# Patient Record
Sex: Female | Born: 2000 | Race: White | Hispanic: No | Marital: Single | Smoking: Never smoker
Health system: Southern US, Community
[De-identification: ages and names within clinical notes are randomized; demographics above are authoritative.]

## PROBLEM LIST (undated history)

## (undated) DIAGNOSIS — O139 Gestational [pregnancy-induced] hypertension without significant proteinuria, unspecified trimester: Secondary | ICD-10-CM

## (undated) DIAGNOSIS — R519 Headache, unspecified: Secondary | ICD-10-CM

## (undated) DIAGNOSIS — F419 Anxiety disorder, unspecified: Secondary | ICD-10-CM

## (undated) DIAGNOSIS — B999 Unspecified infectious disease: Secondary | ICD-10-CM

## (undated) DIAGNOSIS — F32A Depression, unspecified: Secondary | ICD-10-CM

## (undated) DIAGNOSIS — F329 Major depressive disorder, single episode, unspecified: Secondary | ICD-10-CM

## (undated) HISTORY — DX: Gestational (pregnancy-induced) hypertension without significant proteinuria, unspecified trimester: O13.9

---
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
## 2018-01-18 ENCOUNTER — Ambulatory Visit (INDEPENDENT_AMBULATORY_CARE_PROVIDER_SITE_OTHER): Payer: Medicaid Other | Admitting: Advanced Practice Midwife

## 2018-01-18 VITALS — BP 123/76 | HR 72 | Wt 121.8 lb

## 2018-01-18 DIAGNOSIS — Z3042 Encounter for surveillance of injectable contraceptive: Secondary | ICD-10-CM

## 2018-01-18 DIAGNOSIS — Z3202 Encounter for pregnancy test, result negative: Secondary | ICD-10-CM | POA: Diagnosis not present

## 2018-01-18 DIAGNOSIS — Z30011 Encounter for initial prescription of contraceptive pills: Secondary | ICD-10-CM | POA: Insufficient documentation

## 2018-01-18 LAB — POCT URINE PREGNANCY: Preg Test, Ur: NEGATIVE

## 2018-01-18 MED ORDER — NORGESTIMATE-ETH ESTRADIOL 0.25-35 MG-MCG PO TABS: 1.0000 | ORAL_TABLET | Freq: Every day | ORAL | 11 refills | Status: DC

## 2018-01-18 NOTE — Progress Notes (Signed)
    GYNECOLOGY CONTRACEPTION ENCOUNTER NOTE  Subjective:   Stephanie Velazquez is a 17 y.o. G0P0000 female here for a routine annual gynecologic exam.  Current complaints: patient is dissatisfied with intermittent spotting she experiences with Depo Provera.   Denies heavy vaginal bleeding, discharge, pelvic pain, problems with intercourse or other gynecologic concerns. Patient states she recently broke up with her boyfriend and has not had intercourse in the past two weeks.   Gynecologic History Patient's last menstrual period was 01/18/2018. Contraception: Depo-Provera injections Pap and Mammogram n/a age 17  Obstetric History OB History  Gravida Para Term Preterm AB Living  0 0 0 0 0 0  SAB TAB Ectopic Multiple Live Births  0 0 0 0 0    No past medical history on file.  Past Surgical History:  Procedure Laterality Date  . WISDOM TOOTH EXTRACTION  04/02/2016    Current Outpatient Medications on File Prior to Visit  Medication Sig Dispense Refill  . medroxyPROGESTERone Acetate 150 MG/ML SUSY INJECT 1 ML (150 MG TOTAL) INTO THE MUSCLE EVERY 3 (THREE) MONTHS. 1 Syringe 3   Current Facility-Administered Medications on File Prior to Visit  Medication Dose Route Frequency Provider Last Rate Last Dose  . medroxyPROGESTERone (DEPO-PROVERA) injection 150 mg  150 mg Intramuscular Q90 days Anyanwu, Ugonna A, MD   150 mg at 10/25/17 1326    No Known Allergies  Social History:  reports that she has never smoked. She has never used smokeless tobacco. She reports that she does not drink alcohol or use drugs.  No family history on file.  The following portions of the patient's history were reviewed and updated as appropriate: allergies, current medications, past family history, past medical history, past social history, past surgical history and problem list.  Review of Systems Pertinent items noted in HPI and remainder of comprehensive ROS otherwise negative.   Objective:  BP 123/76    Pulse 72   Wt 121 lb 12.8 oz (55.2 kg)   LMP 01/18/2018   BMI 18.52 kg/m  CONSTITUTIONAL: Well-developed, well-nourished female in no acute distress.  EYES: Conjunctivae and EOM are normal. Pupils are equal, round, and reactive to light. No scleral icterus.  NECK: Normal range of motion, supple, no masses.  Normal thyroid.  SKIN: Skin is warm and dry. No rash noted. Not diaphoretic. No erythema. No pallor. MUSCULOSKELETAL: Normal range of motion. Movements are coordinated, intentional and voluntary NEUROLOGIC: Alert and oriented to person, place, and time. Normal reflexes, muscle tone coordination. No cranial nerve deficit noted. PSYCHIATRIC: Normal mood and affect. Normal behavior. Normal judgment and thought content. CARDIOVASCULAR: Normal heart rate noted, regular rhythm RESPIRATORY: Clear to auscultation bilaterally. Effort and breath sounds normal, no problems with respiration noted.   Assessment and Plan:  1. Encounter for surveillance of injectable contraceptive - POCT urine pregnancy -Negative pregnancy test today  2. Oral contraception initial prescription -Rx Sprintec to pharmacy of record. Reviewed side effects, influence on menstrual cycle, what to do in event of missed pill -Spotting on Depo, injection due 11/19-12/03. Plan to use backup contraception for intercourse in the next ten days   Routine preventative health maintenance measures emphasized. Please refer to After Visit Summary for other counseling recommendations.   Clayton BiblesSamantha Ronnetta Currington, CNM 01/18/18 1:26 PM

## 2018-01-23 ENCOUNTER — Ambulatory Visit: Payer: No Typology Code available for payment source

## 2018-02-23 ENCOUNTER — Emergency Department (HOSPITAL_COMMUNITY)
Admission: EM | Admit: 2018-02-23 | Discharge: 2018-02-24 | Disposition: A | Discharge status: Home / Self Care | Payer: Medicaid Other | Attending: Emergency Medicine | Admitting: Emergency Medicine

## 2018-02-23 DIAGNOSIS — F329 Major depressive disorder, single episode, unspecified: Secondary | ICD-10-CM | POA: Insufficient documentation

## 2018-02-23 DIAGNOSIS — F4323 Adjustment disorder with mixed anxiety and depressed mood: Secondary | ICD-10-CM | POA: Insufficient documentation

## 2018-02-23 DIAGNOSIS — F321 Major depressive disorder, single episode, moderate: Secondary | ICD-10-CM

## 2018-02-23 HISTORY — DX: Depression, unspecified: F32.A

## 2018-02-23 HISTORY — DX: Major depressive disorder, single episode, unspecified: F32.9

## 2018-02-23 NOTE — ED Notes (Signed)
Bed: WTR5 Expected date:  Expected time:  Means of arrival:  Comments: 

## 2018-02-24 ENCOUNTER — Encounter (HOSPITAL_COMMUNITY): Payer: Self-pay | Admitting: *Deleted

## 2018-02-24 ENCOUNTER — Other Ambulatory Visit: Payer: Self-pay

## 2018-02-24 NOTE — BH Assessment (Signed)
Tele Assessment Note   Patient Name: Stephanie Velazquez MRN: [id number] Referring Physician:  [id number]  Stephanie Velazquez is an 18 y.o., single female. [facility] an accompanied by her mother, Stephanie Velazquez. Pt stated, "I brought myself here, my mom just drove me. I felt like I didn't want to be here over a stupid break up. I didn't do anything and I don't want to do anything. I don't I guess I just wanted attention. I want to go home. I have never hurt myself and I don't plan to." Pt stated that she had a break up after 3 years, about 2 months ago and she's had increased depression since then. Pt reports tearfulness, sadness, loss of motivation. Pt reports that [facility] a [facility]. Pt denied current MH medications. Pt denied SI/HI/AH/VH/SA.   Pt stated that she lives with her father and stepmother. 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 Pt stated that she has Medicaid. Pt reports no hx of trauma. Pt denied legal hx and probation.  Pt oriented to person, place and situation. Pt presented alert, dressed appropriately and groomed. Pt spoke clearly, coherently and did not seem to be under the influence of any substances. Pt made good eye contact and answered questions appropriately. Pt presented depressed, but calm and open to the assessment process. Pt presented with no impairments of remote or recent memory.   Diagnosis: F43.23 Adjustment disorder, With mixed anxiety and depressed mood   Past Medical History:  Past Medical History:  Diagnosis Date  . Depression     Past Surgical History:  Procedure Laterality Date  . WISDOM TOOTH EXTRACTION  04/02/2016    Family History: No family history on file.  Social History:  reports that she has never smoked. She has never used smokeless tobacco. She reports that she does not drink alcohol  or use drugs.  Additional Social History:  Alcohol / Drug Use Pain Medications: SEE MAR Prescriptions: Pt denied MH medications.  Over the Counter: SEE MAR History of alcohol / drug use?: No history of alcohol / drug abuse  CIWA: CIWA-Ar BP: 126/77 Pulse Rate: 87 COWS:    Allergies: No Known Allergies  Home Medications: (Not in a hospital admission)   OB/GYN Status:  Patient's last menstrual period was 02/14/2018 (approximate).  General Assessment Data [facility] of Assessment: [facility] TTS Assessment: In system Is this a Tele or Face-to-Face Assessment?: Tele Assessment Is this an Initial Assessment or a Re-assessment for this encounter?: Initial Assessment Patient Accompanied by:: Parent(Stephanie Anette RiedelNoah- Mother) Language Other than English: No Living Arrangements: Other (Comment)(Pt reports living with her father. ) What gender do you identify as?: Female Marital status: Single Maiden name: N/A Pregnancy Status: No Living Arrangements: Parent(Pt reports livign with father and stepmother. ) Can pt return to current living arrangement?: Yes Admission Status: Voluntary Is patient capable of signing voluntary admission?: Yes Referral Source: Self/Family/Friend Insurance type: Medicaid   Medical Screening Exam [facility] Walk-in ONLY) Medical Exam completed: Yes  Crisis Care Plan Living Arrangements: Parent(Pt reports livign with father and stepmother. ) Legal Guardian: Other:(Self ) Name of Psychiatrist: Denied Name of Therapist: [facility]    Education Status Is patient currently in school?: Yes Current Grade: College- [employer] Highest grade of school patient has completed: [employer] Name of school: [employer] Contact person: N/A IEP information if applicable:  N/A  Risk to self with the past 6 months Suicidal Ideation: No Has patient been a risk to self within the past 6 months prior to admission? : No Suicidal Intent: No Has patient had any suicidal intent within the  past 6 months prior to admission? : No Is patient at risk for suicide?: No Suicidal Plan?: No Has patient had any suicidal plan within the past 6 months prior to admission? : No Access to Means: No What has been your use of drugs/alcohol within the last 12 months?: Denied Previous Attempts/Gestures: No How many times?: 0 Other Self Harm Risks: Pt denied Triggers for Past Attempts: Other (Comment)(Pt denied. ) Intentional Self Injurious Behavior: None Family Suicide History: No Recent stressful life event(s): Loss (Comment)(Pt reports breaking up with boyfriend after 3 years. ) Persecutory voices/beliefs?: No Depression: Yes Depression Symptoms: Despondent, Tearfulness, Isolating, Guilt, Loss of interest in usual pleasures, Feeling worthless/self pity Substance abuse history and/or treatment for substance abuse?: No Suicide prevention information given to non-admitted patients: Not applicable  Risk to Others within the past 6 months Homicidal Ideation: No Does patient have any lifetime risk of violence toward others beyond the six months prior to admission? : No Thoughts of Harm to Others: No Current Homicidal Intent: No Current Homicidal Plan: No Access to Homicidal Means: No Identified Victim: Denied History of harm to others?: No Assessment of Violence: None Noted Violent Behavior Description: Denied Does patient have access to weapons?: No Criminal Charges Pending?: No Does patient have a court date: No Is patient on probation?: No  Psychosis Hallucinations: None noted Delusions: None noted  Mental Status Report Appearance/Hygiene: Unremarkable Eye Contact: Good Motor Activity: Freedom of movement Speech: Logical/coherent Level of Consciousness: Alert Mood: Sad Affect: Blunted Anxiety Level: Moderate Thought Processes: Coherent, Relevant Judgement: Partial Orientation: Person, Place, Time, Situation, Appropriate for developmental age Obsessive Compulsive  Thoughts/Behaviors: None  Cognitive Functioning Concentration: Normal Memory: Recent Intact, Remote Intact Is patient IDD: No Insight: Fair Impulse Control: Fair Appetite: Good Have you had any weight changes? : No Change Sleep: No Change Total Hours of Sleep: 8 Vegetative Symptoms: None  ADLScreening [facility] Assessment Services) Patient's cognitive ability adequate to safely complete daily activities?: Yes Patient able to express need for assistance with ADLs?: Yes Independently performs ADLs?: Yes (appropriate for developmental age)  Prior Inpatient Therapy Prior Inpatient Therapy: No  Prior Outpatient Therapy Prior Outpatient Therapy: No Does patient have an ACCT team?: No Does patient have Intensive In-House Services?  : No Does patient have [facility] services? : No Does patient have P4CC services?: No  ADL Screening (condition at time of admission) Patient's cognitive ability adequate to safely complete daily activities?: Yes Is the patient deaf or have difficulty hearing?: No Does the patient have difficulty seeing, even when wearing glasses/contacts?: No Does the patient have difficulty concentrating, remembering, or making decisions?: No Patient able to express need for assistance with ADLs?: Yes Does the patient have difficulty dressing or bathing?: No Independently performs ADLs?: Yes (appropriate for developmental age) Does the patient have difficulty walking or climbing stairs?: No Weakness of Legs: None Weakness of Arms/Hands: None  Home Assistive Devices/Equipment Home Assistive Devices/Equipment: None  Therapy Consults (therapy consults require a physician order) PT Evaluation Needed: No OT Evalulation Needed: No SLP Evaluation Needed: No Abuse/Neglect Assessment (Assessment to be complete while patient is alone) Abuse/Neglect Assessment Can Be Completed: Yes Physical Abuse: Denies Verbal Abuse: Denies Sexual Abuse: Denies Exploitation of  patient/patient's resources: Denies Self-Neglect: Denies Values / Beliefs  Cultural Requests During Hospitalization: None Spiritual Requests During Hospitalization: None Consults Spiritual Care Consult Needed: No Social Work Consult Needed: No Merchant navy officer (For Healthcare) Does Patient Have a Medical Advance Directive?: No Would patient like information on creating a medical advance directive?: No - Patient declined          Disposition: Per Donell Sievert, PA; Pt to be discharged and follow up with [facility]. AC, Penny informed of pt disposition.  Disposition Initial Assessment Completed for this Encounter: Yes Patient referred to: Outpatient clinic referral([facility] )  This service was provided via telemedicine using a 2-way, interactive audio and video technology.  Names of all persons participating in this telemedicine service and their role in this encounter. Name: Mathis Fare  Role: Patient   Name: Stephanie Silence  Role: Patient Mother   Name: Chesley Noon  Role: Clinician   Name:  Role:    Chesley Noon, M.S., [facility], LCAS Triage Specialist [facility]  02/24/2018 1:13 AM

## 2018-02-24 NOTE — ED Triage Notes (Signed)
Pt sent text to parents stating she was going to kill herself.  Pt now stating "I didn't really mean it.  I just wanted attention.  I go to therapy.  My boyfriend broke up with me after 3 years.  I didn't go through with it.  I don't think I could."

## 2018-02-24 NOTE — Progress Notes (Signed)
Nurse, Joy informed of pt disposition.

## 2018-02-24 NOTE — ED Notes (Signed)
TTS consult in process. 

## 2018-02-24 NOTE — ED Provider Notes (Signed)
[facility]-EMERGENCY DEPT Provider Note   CSN: [id number] Arrival date & time: 02/23/18  2314     History   Chief Complaint Chief Complaint  Patient presents with  . Suicidal    HPI Stephanie Velazquez is a 18 y.o. female.  HPI 18 year old female with history of depression here with transient suicidal ideation.  The patient states that since her boyfriend broke up with her 2 months ago, she has had increasing depression.  She has had poor appetite, difficulty sleeping, and anhedonia.  She reports that earlier today, she just felt like she did not want to be here, that should not have been overt suicidal plan.  She texted her parents, and was brought to the ER.  She currently states that this was a mistake.  She does endorse depressive symptoms but recently was evaluated [facility] and is scheduled to begin treatment next week.  No other complaints currently.  She does not take any medications.  No pain.  No fevers or chills.  She do not harm her self.  Denies any suicidal ideation or homicidal patient at this time.  Past Medical History:  Diagnosis Date  . Depression     Patient Active Problem List   Diagnosis Date Noted  . Oral contraception initial prescription 01/18/2018    Past Surgical History:  Procedure Laterality Date  . WISDOM TOOTH EXTRACTION  04/02/2016     OB History    Gravida  0   Para  0   Term  0   Preterm  0   AB  0   Living  0     SAB  0   TAB  0   Ectopic  0   Multiple  0   Live Births  0            Home Medications    Prior to Admission medications   Medication Sig Start Date End Date Taking? Authorizing Provider  norgestimate-ethinyl estradiol (ORTHO-CYCLEN,SPRINTEC,PREVIFEM) 0.25-35 MG-MCG tablet Take 1 tablet by mouth daily. 01/18/18  Yes Weinhold, Lelon Mast C, CNM  medroxyPROGESTERone Acetate 150 MG/ML SUSY INJECT 1 ML (150 MG TOTAL) INTO THE MUSCLE EVERY 3 (THREE) MONTHS. Patient not taking: Reported on  02/23/2018 05/16/17   Tereso Newcomer, MD    Family History No family history on file.  Social History Social History   Tobacco Use  . Smoking status: Never Smoker  . Smokeless tobacco: Never Used  Substance Use Topics  . Alcohol use: No  . Drug use: No     Allergies   Patient has no known allergies.   Review of Systems Review of Systems  Constitutional: Positive for fatigue. Negative for chills and fever.  HENT: Negative for congestion, rhinorrhea and sore throat.   Eyes: Negative for visual disturbance.  Respiratory: Negative for cough, shortness of breath and wheezing.   Cardiovascular: Negative for chest pain and leg swelling.  Gastrointestinal: Negative for abdominal pain, diarrhea, nausea and vomiting.  Genitourinary: Negative for dysuria, flank pain, vaginal bleeding and vaginal discharge.  Musculoskeletal: Negative for neck pain.  Skin: Negative for rash.  Allergic/Immunologic: Negative for immunocompromised state.  Neurological: Negative for syncope and headaches.  Hematological: Does not bruise/bleed easily.  Psychiatric/Behavioral: Positive for decreased concentration, dysphoric mood and sleep disturbance.  All other systems reviewed and are negative.    Physical Exam Updated Vital Signs BP 126/77 (BP Location: Right Arm)   Pulse 87   Temp 98.6 F (37 C) (Oral)  Resp 15   Ht 5\' 8"  (1.727 m)   Wt 56.7 kg   LMP 02/14/2018 (Approximate)   SpO2 97%   BMI 19.01 kg/m   Physical Exam Vitals signs and nursing note reviewed.  Constitutional:      General: She is not in acute distress.    Appearance: She is well-developed.  HENT:     Head: Normocephalic and atraumatic.  Eyes:     Conjunctiva/sclera: Conjunctivae normal.  Neck:     Musculoskeletal: Neck supple.  Cardiovascular:     Rate and Rhythm: Normal rate and regular rhythm.     Heart sounds: Normal heart sounds. No murmur. No friction rub.  Pulmonary:     Effort: Pulmonary effort is normal.  No respiratory distress.     Breath sounds: Normal breath sounds. No wheezing or rales.  Abdominal:     General: There is no distension.     Palpations: Abdomen is soft.     Tenderness: There is no abdominal tenderness.  Skin:    General: Skin is warm.     Capillary Refill: Capillary refill takes less than 2 seconds.  Neurological:     Mental Status: She is alert and oriented to person, place, and time.     Motor: No abnormal muscle tone.  Psychiatric:        Attention and Perception: Attention and perception normal.        Mood and Affect: Mood is depressed.        Speech: Speech normal.        Behavior: Behavior normal.        Thought Content: Thought content does not include homicidal or suicidal ideation. Thought content does not include homicidal or suicidal plan.        Cognition and Memory: Cognition and memory normal.      ED Treatments / Results  Labs (all labs ordered are listed, but only abnormal results are displayed) Labs Reviewed  COMPREHENSIVE METABOLIC PANEL  ETHANOL  SALICYLATE LEVEL  ACETAMINOPHEN LEVEL  CBC  RAPID URINE DRUG SCREEN, HOSP PERFORMED  I-STAT BETA HCG BLOOD, ED ([facility], [facility], [facility] ONLY)    EKG None  Radiology No results found.  Procedures Procedures (including critical care time)  Medications Ordered in ED Medications - No data to display   Initial Impression / Assessment and Plan / ED Course  I have reviewed the triage vital signs and the nursing notes.  Pertinent labs & imaging results that were available during my care of the patient were reviewed by me and considered in my medical decision making (see chart for details).     18 year old female with history of depression here with transient, passive suicidal ideation, now completely resolved.  She is here with family, denies any active suicidal ideation, and has multiple protective factors.  She has a scheduled outpatient psychiatry appointment soon.  She was evaluated by TTS and  cleared for discharge.  Given her stable vitals, no medical complaints, do not feel lab work is indicated at this time.  Will discharge with close outpatient follow-up and good return precautions.  Patient discharged with mother.  Final Clinical Impressions(s) / ED Diagnoses   Final diagnoses:  Current moderate episode of major depressive disorder without prior episode [facility])    ED Discharge Orders    None       Shaune Pollack, MD 02/24/18 [phone]

## 2018-05-23 ENCOUNTER — Telehealth: Payer: Self-pay | Admitting: *Deleted

## 2018-05-23 NOTE — Telephone Encounter (Signed)
-----   Message from Heather B Walker, NT sent at 05/22/2018  1:10 PM EDT ----- Regarding: discuss symptoms of BC Patient would a phone call about some symptoms that she feels she is havinf due to Bc and would like to know if she needs to seen.   

## 2018-05-24 ENCOUNTER — Telehealth: Payer: Self-pay

## 2018-05-24 NOTE — Telephone Encounter (Signed)
Called patient to get a little more information regarding symptoms she has been experiencing. No answer or voice mail to leave a message.

## 2018-05-24 NOTE — Telephone Encounter (Signed)
-----   Message from Rozann Lesches, NT sent at 05/22/2018  1:10 PM EDT ----- Regarding: discuss symptoms of BC Patient would a phone call about some symptoms that she feels she is havinf due to [facility] and would like to know if she needs to seen.

## 2018-05-29 ENCOUNTER — Telehealth: Payer: Self-pay | Admitting: Obstetrics & Gynecology

## 2020-02-23 NOTE — L&D Delivery Note (Signed)
OB/GYN Faculty Practice Delivery Note  Stephanie Velazquez is a 20 y.o. G1P1001 s/p NSVD at [redacted]w[redacted]d. She was admitted for IOL due to pre-eclampsia without severe features.   ROM: 4h 21m with clear fluid GBS Status: Positive; PCN  Delivery Date/Time: 11/04/20 at 1421   Delivery: Called to room and patient was complete and pushing. Head delivered direct occiput anterior. Loose nuchal x2 present and reduced. Shoulder and body delivered in usual fashion. Infant with spontaneous cry, placed on mother's abdomen, dried and stimulated. Cord clamped x 2 after 1-minute delay, and cut by FOB under my direct supervision. Cord blood drawn. Placenta delivered spontaneously with gentle cord traction. Fundus firm with massage and Pitocin. Labia, perineum, vagina, and cervix were inspected, and patient was found to have a 1st degree perineal and bilateral labial lacerations. The right labial laceration and 1st degree perineal laceration were repaired with 3-0 Vicryl and found to be hemostatic. Additional trickle of bleeding noted after repair. Patient was given TXA and Methergine. Lower uterine segment sweep performed by Dr. Para March with removal of clots. Bleeding improved afterwards.   Placenta: Spontaneous intact; 3 VC; marginal cord insertion sent to pathology  Complications: None  Lacerations: 1st degree perineal; bilateral labial  EBL: 640 cc Analgesia: Epidural   Infant: Viable female  APGARs 8 and 8  Stephanie Field, MD OB/GYN Fellow, Faculty Practice

## 2020-04-02 ENCOUNTER — Encounter: Payer: Self-pay | Admitting: Radiology

## 2020-04-02 ENCOUNTER — Other Ambulatory Visit: Payer: Self-pay

## 2020-04-02 ENCOUNTER — Ambulatory Visit: Payer: Medicaid Other

## 2020-04-02 VITALS — BP 146/75 | HR 80

## 2020-04-02 DIAGNOSIS — Z3201 Encounter for pregnancy test, result positive: Secondary | ICD-10-CM | POA: Diagnosis not present

## 2020-04-02 LAB — POCT URINE PREGNANCY: Preg Test, Ur: POSITIVE — AB

## 2020-04-02 NOTE — Progress Notes (Signed)
Pt presents for confirmation UPT, pt had positive test at home. Pt is currently on birth control with irregular cycles.  UPT: positive Pt informed of results

## 2020-04-03 NOTE — Progress Notes (Signed)
Attestation of Attending Supervision of clinical support staff: I agree with the care provided to this patient and was available for any consultation.  I have reviewed the CMA's note and chart, and I agree with the management and plan.  Ivi Griffith Niles Jonie Burdell, MD, MPH, ABFM Attending Physician Faculty Practice- [facility]  

## 2020-04-08 ENCOUNTER — Other Ambulatory Visit: Payer: Self-pay

## 2020-04-08 ENCOUNTER — Ambulatory Visit (INDEPENDENT_AMBULATORY_CARE_PROVIDER_SITE_OTHER): Payer: Medicaid Other | Admitting: *Deleted

## 2020-04-08 DIAGNOSIS — Z3400 Encounter for supervision of normal first pregnancy, unspecified trimester: Secondary | ICD-10-CM | POA: Insufficient documentation

## 2020-04-08 DIAGNOSIS — Z3A01 Less than 8 weeks gestation of pregnancy: Secondary | ICD-10-CM | POA: Diagnosis not present

## 2020-04-08 DIAGNOSIS — Z3401 Encounter for supervision of normal first pregnancy, first trimester: Secondary | ICD-10-CM

## 2020-04-08 DIAGNOSIS — O0990 Supervision of high risk pregnancy, unspecified, unspecified trimester: Secondary | ICD-10-CM | POA: Insufficient documentation

## 2020-04-08 MED ORDER — DOXYLAMINE-PYRIDOXINE 10-10 MG PO TBEC: 2.0000 | DELAYED_RELEASE_TABLET | Freq: Every day | ORAL | 5 refills | Status: DC

## 2020-04-08 MED ORDER — PROMETHAZINE HCL 25 MG PO TABS: 25.0000 mg | ORAL_TABLET | Freq: Four times a day (QID) | ORAL | 2 refills | Status: DC | PRN

## 2020-04-08 NOTE — Progress Notes (Signed)
PRENATAL INTAKE SUMMARY  Ms. Stephanie Velazquez presents today New OB Nurse Interview and viability scan.  OB History    Gravida  1   Para  0   Term  0   Preterm  0   AB  0   Living  0     SAB  0   IAB  0   Ectopic  0   Multiple  0   Live Births  0          I have reviewed the patient's medical, obstetrical, social, and family histories, medications, and available lab results.  SUBJECTIVE She has no unusual complaints and complains of nausea without vomiting  OBJECTIVE Initial Physical Exam (New OB), confirm viability due to unsure dating.  GENERAL APPEARANCE: alert, well appearing   ASSESSMENT Normal pregnancy  PLAN Prenatal care with new OB visit in about 3-4 weeks. Medications for nausea sent in.    DATING AND VIABILITY SONOGRAM   Stephanie Velazquez is a 20 y.o. year old G1P0000 with LMP No LMP recorded (lmp unknown). Patient is pregnant..  She has irregular menstrual cycles due to being on OCP's.  She is here today for a confirmatory initial sonogram.    GESTATION: SINGLETON yes  FETAL ACTIVITY:          Heart rate        158          The fetus is current.   GESTATIONAL AGE AND  BIOMETRICS:  Gestational criteria: Estimated Date of Delivery: 11/24/20 by early ultrasound now at [redacted]w[redacted]d  Previous Scans:0  GESTATIONAL SAC            mm          weeks  CROWN RUMP LENGTH           1.10cm         7.1weeks                                                   AVERAGE EGA(BY THIS SCAN):  7.1 weeks  WORKING EDD( early ultrasound ):  11/24/2020     TECHNICIAN COMMENTS:  Patient informed that the ultrasound is considered a limited obstetric ultrasound and is not intended to be a complete ultrasound exam.  Patient also informed that the ultrasound is not being completed with the intent of assessing for fetal or placental anomalies or any pelvic abnormalities. Explained that the purpose of today's ultrasound is to assess for fetal heart rate.  Patient acknowledges the  purpose of the exam and the limitations of the study.           A copy of this report including all images has been saved and backed up to a second source for retrieval if needed. All measures and details of the anatomical scan, placentation, fluid volume and pelvic anatomy are contained in that report.  Scheryl Marten 04/08/2020 1:59 PM

## 2020-04-08 NOTE — Patient Instructions (Signed)
https://www.cdc.gov/pregnancy/infections.html">  First Trimester of Pregnancy  The first trimester of pregnancy starts on the first day of your last menstrual period until the end of week 12. This is also called months 1 through 3 of pregnancy. Body changes during your first trimester Your body goes through many changes during pregnancy. The changes usually return to normal after your baby is born. Physical changes  You may gain or lose weight.  Your breasts may grow larger and hurt. The area around your nipples may get darker.  Dark spots or blotches may develop on your face.  You may have changes in your hair. Health changes  You may feel like you might vomit (nauseous), and you may vomit.  You may have heartburn.  You may have headaches.  You may have trouble pooping (constipation).  Your gums may bleed. Other changes  You may get tired easily.  You may pee (urinate) more often.  Your menstrual periods will stop.  You may not feel hungry.  You may want to eat certain kinds of food.  You may have changes in your emotions from day to day.  You may have more dreams. Follow these instructions at home: Medicines  Take over-the-counter and prescription medicines only as told by your doctor. Some medicines are not safe during pregnancy.  Take a prenatal vitamin that contains at least 600 micrograms (mcg) of folic acid. Eating and drinking  Eat healthy meals that include: ? Fresh fruits and vegetables. ? Whole grains. ? Good sources of protein, such as meat, eggs, or tofu. ? Low-fat dairy products.  Avoid raw meat and unpasteurized juice, milk, and cheese.  If you feel like you may vomit, or you vomit: ? Eat 4 or 5 small meals a day instead of 3 large meals. ? Try eating a few soda crackers. ? Drink liquids between meals instead of during meals.  You may need to take these actions to prevent or treat trouble pooping: ? Drink enough fluids to keep your pee  (urine) pale yellow. ? Eat foods that are high in fiber. These include beans, whole grains, and fresh fruits and vegetables. ? Limit foods that are high in fat and sugar. These include fried or sweet foods. Activity  Exercise only as told by your doctor. Most people can do their usual exercise routine during pregnancy.  Stop exercising if you have cramps or pain in your lower belly (abdomen) or low back.  Do not exercise if it is too hot or too humid, or if you are in a place of great height (high altitude).  Avoid heavy lifting.  If you choose to, you may have sex unless your doctor tells you not to. Relieving pain and discomfort  Wear a good support bra if your breasts are sore.  Rest with your legs raised (elevated) if you have leg cramps or low back pain.  If you have bulging veins (varicose veins) in your legs: ? Wear support hose as told by your doctor. ? Raise your feet for 15 minutes, 3-4 times a day. ? Limit salt in your food. Safety  Wear your seat belt at all times when you are in a car.  Talk with your doctor if someone is hurting you or yelling at you.  Talk with your doctor if you are feeling sad or have thoughts of hurting yourself. Lifestyle  Do not use hot tubs, steam rooms, or saunas.  Do not douche. Do not use tampons or scented sanitary pads.  Do not   use herbal medicines, illegal drugs, or medicines that are not approved by your doctor. Do not drink alcohol.  Do not smoke or use any products that contain nicotine or tobacco. If you need help quitting, ask your doctor.  Avoid cat litter boxes and soil that is used by cats. These carry germs that can cause harm to the baby and can cause a loss of your baby by miscarriage or stillbirth. General instructions  Keep all follow-up visits. This is important.  Ask for help if you need counseling or if you need help with nutrition. Your doctor can give you advice or tell you where to go for help.  Visit your  dentist. At home, brush your teeth with a soft toothbrush. Floss gently.  Write down your questions. Take them to your prenatal visits. Where to find more information  American Pregnancy Association: americanpregnancy.org  American College of Obstetricians and Gynecologists: www.acog.org  Office on Women's Health: womenshealth.gov/pregnancy Contact a doctor if:  You are dizzy.  You have a fever.  You have mild cramps or pressure in your lower belly.  You have a nagging pain in your belly area.  You continue to feel like you may vomit, you vomit, or you have watery poop (diarrhea) for 24 hours or longer.  You have a bad-smelling fluid coming from your vagina.  You have pain when you pee.  You are exposed to a disease that spreads from person to person, such as chickenpox, measles, Zika virus, HIV, or hepatitis. Get help right away if:  You have spotting or bleeding from your vagina.  You have very bad belly cramping or pain.  You have shortness of breath or chest pain.  You have any kind of injury, such as from a fall or a car crash.  You have new or increased pain, swelling, or redness in an arm or leg. Summary  The first trimester of pregnancy starts on the first day of your last menstrual period until the end of week 12 (months 1 through 3).  Eat 4 or 5 small meals a day instead of 3 large meals.  Do not smoke or use any products that contain nicotine or tobacco. If you need help quitting, ask your doctor.  Keep all follow-up visits. This information is not intended to replace advice given to you by your health care provider. Make sure you discuss any questions you have with your health care provider. Document Revised: 07/18/2019 Document Reviewed: 05/24/2019 Elsevier Patient Education  2021 Elsevier Inc.  

## 2020-04-08 NOTE — Progress Notes (Signed)
Patient was assessed and managed by nursing staff during this encounter. I have reviewed the chart and reviewed ultrasound images.  I agree with the documentation and plan.   Jaynie Collins, MD 04/08/2020 2:58 PM

## 2020-04-15 ENCOUNTER — Other Ambulatory Visit: Payer: Self-pay

## 2020-04-15 ENCOUNTER — Other Ambulatory Visit (HOSPITAL_COMMUNITY)
Admission: RE | Admit: 2020-04-15 | Discharge: 2020-04-15 | Disposition: A | Discharge status: Home / Self Care | Payer: Medicaid Other | Source: Ambulatory Visit | Attending: Obstetrics and Gynecology | Admitting: Obstetrics and Gynecology

## 2020-04-15 ENCOUNTER — Ambulatory Visit (INDEPENDENT_AMBULATORY_CARE_PROVIDER_SITE_OTHER): Payer: Medicaid Other | Admitting: *Deleted

## 2020-04-15 VITALS — BP 122/75 | HR 66

## 2020-04-15 DIAGNOSIS — R109 Unspecified abdominal pain: Secondary | ICD-10-CM | POA: Diagnosis not present

## 2020-04-15 DIAGNOSIS — O26899 Other specified pregnancy related conditions, unspecified trimester: Secondary | ICD-10-CM | POA: Insufficient documentation

## 2020-04-15 DIAGNOSIS — N898 Other specified noninflammatory disorders of vagina: Secondary | ICD-10-CM | POA: Insufficient documentation

## 2020-04-15 LAB — POCT URINALYSIS DIPSTICK
Blood, UA: NEGATIVE
Leukocytes, UA: NEGATIVE

## 2020-04-15 NOTE — Progress Notes (Signed)
SUBJECTIVE:  19 y.o. female complains of cramping and unsure if she has vaginitis or a UTI.  Denies abnormal vaginal bleeding or significant pelvic pain or fever. No UTI symptoms. Denies history of known exposure to STD.  No LMP recorded (lmp unknown). Patient is pregnant.  OBJECTIVE:  She appears well, afebrile. Urine dipstick: negative for all components.  ASSESSMENT:  Vaginal Discharge  Cramping   PLAN:  BVAG, CVAG probe sent to lab. Treatment: To be determined once lab results are received ROV prn if symptoms persist or worsen.

## 2020-04-15 NOTE — Progress Notes (Signed)
Patient was assessed and managed by nursing staff during this encounter. I have reviewed the chart and agree with the documentation and plan. I have also made any necessary editorial changes.  Jaynie Collins, MD 04/15/2020 8:06 PM

## 2020-04-17 LAB — CERVICOVAGINAL ANCILLARY ONLY
Bacterial Vaginitis (gardnerella): NEGATIVE
Candida Glabrata: NEGATIVE
Candida Vaginitis: NEGATIVE
Comment: NEGATIVE
Comment: NEGATIVE
Comment: NEGATIVE

## 2020-04-18 LAB — URINE CULTURE, OB REFLEX

## 2020-04-18 LAB — CULTURE, OB URINE

## 2020-04-20 ENCOUNTER — Other Ambulatory Visit: Payer: Self-pay | Admitting: Obstetrics & Gynecology

## 2020-04-20 DIAGNOSIS — R399 Unspecified symptoms and signs involving the genitourinary system: Secondary | ICD-10-CM

## 2020-04-20 DIAGNOSIS — R8271 Bacteriuria: Secondary | ICD-10-CM

## 2020-04-20 MED ORDER — CEFADROXIL 500 MG PO CAPS: 500.0000 mg | ORAL_CAPSULE | Freq: Two times a day (BID) | ORAL | 0 refills | Status: AC

## 2020-04-29 ENCOUNTER — Telehealth: Payer: Self-pay | Admitting: *Deleted

## 2020-04-29 MED ORDER — CLOTRIMAZOLE 1 % VA CREA: 1.0000 | TOPICAL_CREAM | Freq: Every day | VAGINAL | 0 refills | Status: DC

## 2020-04-29 NOTE — Telephone Encounter (Signed)
Pt called stating she has a yeast infection from the antibiotic she took for her UTI, was advised to try OTC monistat. She thinks she had an allergic reaction to Monistat, she used it last night and the irritation became sore and she has a rash on her inner thighs and abdomen. Advised pt to used OTC hydrocortisone cream on the rash area and she can also take benadryl for the itching. Will ask the provider what we can send in for the yeast infection and will send it in.

## 2020-05-07 ENCOUNTER — Other Ambulatory Visit: Payer: Self-pay

## 2020-05-07 ENCOUNTER — Other Ambulatory Visit (HOSPITAL_COMMUNITY)
Admission: RE | Admit: 2020-05-07 | Discharge: 2020-05-07 | Disposition: A | Discharge status: Home / Self Care | Payer: Medicaid Other | Source: Ambulatory Visit | Attending: Advanced Practice Midwife | Admitting: Advanced Practice Midwife

## 2020-05-07 ENCOUNTER — Ambulatory Visit (INDEPENDENT_AMBULATORY_CARE_PROVIDER_SITE_OTHER): Payer: Medicaid Other | Admitting: Advanced Practice Midwife

## 2020-05-07 ENCOUNTER — Encounter: Payer: Medicaid Other | Admitting: Advanced Practice Midwife

## 2020-05-07 VITALS — BP 146/85 | HR 77 | Wt 147.0 lb

## 2020-05-07 DIAGNOSIS — R030 Elevated blood-pressure reading, without diagnosis of hypertension: Secondary | ICD-10-CM

## 2020-05-07 DIAGNOSIS — O2341 Unspecified infection of urinary tract in pregnancy, first trimester: Secondary | ICD-10-CM

## 2020-05-07 DIAGNOSIS — Z3401 Encounter for supervision of normal first pregnancy, first trimester: Secondary | ICD-10-CM

## 2020-05-07 DIAGNOSIS — O2340 Unspecified infection of urinary tract in pregnancy, unspecified trimester: Secondary | ICD-10-CM | POA: Insufficient documentation

## 2020-05-07 NOTE — Patient Instructions (Signed)
Second Trimester of Pregnancy  The second trimester of pregnancy is from week 13 through week 27. This is also called months 4 through 6 of pregnancy. This is often the time when you feel your best. During the second trimester:  Morning sickness is less or has stopped.  You may have more energy.  You may feel hungry more often. At this time, your unborn baby (fetus) is growing very fast. At the end of the sixth month, the unborn baby may be up to 12 inches long and weigh about 1 pounds. You will likely start to feel the baby move between 16 and 20 weeks of pregnancy. Body changes during your second trimester Your body continues to go through many changes during this time. The changes vary and generally return to normal after the baby is born. Physical changes  You will gain more weight.  You may start to get stretch marks on your hips, belly (abdomen), and breasts.  Your breasts will grow and may hurt.  Dark spots or blotches may develop on your face.  A dark line from your belly button to the pubic area (linea nigra) may appear.  You may have changes in your hair. Health changes  You may have headaches.  You may have heartburn.  You may have trouble pooping (constipation).  You may have hemorrhoids or swollen, bulging veins (varicose veins).  Your gums may bleed.  You may pee (urinate) more often.  You may have back pain. Follow these instructions at home: Medicines  Take over-the-counter and prescription medicines only as told by your doctor. Some medicines are not safe during pregnancy.  Take a prenatal vitamin that contains at least 600 micrograms (mcg) of folic acid. Eating and drinking  Eat healthy meals that include: ? Fresh fruits and vegetables. ? Whole grains. ? Good sources of protein, such as meat, eggs, or tofu. ? Low-fat dairy products.  Avoid raw meat and unpasteurized juice, milk, and cheese.  You may need to take these actions to prevent or  treat trouble pooping: ? Drink enough fluids to keep your pee (urine) pale yellow. ? Eat foods that are high in fiber. These include beans, whole grains, and fresh fruits and vegetables. ? Limit foods that are high in fat and sugar. These include fried or sweet foods. Activity  Exercise only as told by your doctor. Most people can do their usual exercise during pregnancy. Try to exercise for 30 minutes at least 5 days a week.  Stop exercising if you have pain or cramps in your belly or lower back.  Do not exercise if it is too hot or too humid, or if you are in a place of great height (high altitude).  Avoid heavy lifting.  If you choose to, you may have sex unless your doctor tells you not to. Relieving pain and discomfort  Wear a good support bra if your breasts are sore.  Take warm water baths (sitz baths) to soothe pain or discomfort caused by hemorrhoids. Use hemorrhoid cream if your doctor approves.  Rest with your legs raised (elevated) if you have leg cramps or low back pain.  If you develop bulging veins in your legs: ? Wear support hose as told by your doctor. ? Raise your feet for 15 minutes, 3-4 times a day. ? Limit salt in your food. Safety  Wear your seat belt at all times when you are in a car.  Talk with your doctor if someone is hurting you or yelling  at you a lot. Lifestyle  Do not use hot tubs, steam rooms, or saunas.  Do not douche. Do not use tampons or scented sanitary pads.  Avoid cat litter boxes and soil used by cats. These carry germs that can harm your baby and can cause a loss of your baby by miscarriage or stillbirth.  Do not use herbal medicines, illegal drugs, or medicines that are not approved by your doctor. Do not drink alcohol.  Do not smoke or use any products that contain nicotine or tobacco. If you need help quitting, ask your doctor. General instructions  Keep all follow-up visits. This is important.  Ask your doctor about local  prenatal classes.  Ask your doctor about the right foods to eat or for help finding a counselor. Where to find more information  American Pregnancy Association: americanpregnancy.org  American College of Obstetricians and Gynecologists: www.acog.org  Office on Women's Health: womenshealth.gov/pregnancy Contact a doctor if:  You have a headache that does not go away when you take medicine.  You have changes in how you see, or you see spots in front of your eyes.  You have mild cramps, pressure, or pain in your lower belly.  You continue to feel like you may vomit (nauseous), you vomit, or you have watery poop (diarrhea).  You have bad-smelling fluid coming from your vagina.  You have pain when you pee or your pee smells bad.  You have very bad swelling of your face, hands, ankles, feet, or legs.  You have a fever. Get help right away if:  You are leaking fluid from your vagina.  You have spotting or bleeding from your vagina.  You have very bad belly cramping or pain.  You have trouble breathing.  You have chest pain.  You faint.  You have not felt your baby move for the time period told by your doctor.  You have new or increased pain, swelling, or redness in an arm or leg. Summary  The second trimester of pregnancy is from week 13 through week 27 (months 4 through 6).  Eat healthy meals.  Exercise as told by your doctor. Most people can do their usual exercise during pregnancy.  Do not use herbal medicines, illegal drugs, or medicines that are not approved by your doctor. Do not drink alcohol.  Call your doctor if you get sick or if you notice anything unusual about your pregnancy. This information is not intended to replace advice given to you by your health care provider. Make sure you discuss any questions you have with your health care provider. Document Revised: 07/18/2019 Document Reviewed: 05/24/2019 Elsevier Patient Education  2021 Elsevier  Inc.  Safe Medications in Pregnancy   Acne: Benzoyl Peroxide Salicylic Acid  Backache/Headache: Tylenol: 2 regular strength every 4 hours OR              2 Extra strength every 6 hours  Colds/Coughs/Allergies: Benadryl (alcohol free) 25 mg every 6 hours as needed Breath right strips Claritin Cepacol throat lozenges Chloraseptic throat spray Cold-Eeze- up to three times per day Cough drops, alcohol free Flonase (by prescription only) Guaifenesin Mucinex Robitussin DM (plain only, alcohol free) Saline nasal spray/drops Sudafed (pseudoephedrine) & Actifed ** use only after [redacted] weeks gestation and if you do not have high blood pressure Tylenol Vicks Vaporub Zinc lozenges Zyrtec   Constipation: Colace Ducolax suppositories Fleet enema Glycerin suppositories Metamucil Milk of magnesia Miralax Senokot Smooth move tea  Diarrhea: Kaopectate Imodium A-D  *NO pepto Bismol    Hemorrhoids: Anusol Anusol HC Preparation H Tucks  Indigestion: Tums Maalox Mylanta Zantac  Pepcid  Insomnia: Benadryl (alcohol free) 25mg  every 6 hours as needed Tylenol PM Unisom, no Gelcaps  Leg Cramps: Tums MagGel  Nausea/Vomiting:  Bonine Dramamine Emetrol Ginger extract Sea bands Meclizine  Nausea medication to take during pregnancy:  Unisom (doxylamine succinate 25 mg tablets) Take one tablet daily at bedtime. If symptoms are not adequately controlled, the dose can be increased to a maximum recommended dose of two tablets daily (1/2 tablet in the morning, 1/2 tablet mid-afternoon and one at bedtime). Vitamin B6 100mg  tablets. Take one tablet twice a day (up to 200 mg per day).  Skin Rashes: Aveeno products Benadryl cream or 25mg  every 6 hours as needed Calamine Lotion 1% cortisone cream  Yeast infection: Gyne-lotrimin 7 Monistat 7   **If taking multiple medications, please check labels to avoid duplicating the same active ingredients **take medication as  directed on the label ** Do not exceed 4000 mg of tylenol in 24 hours **Do not take medications that contain aspirin or ibuprofen

## 2020-05-07 NOTE — Progress Notes (Signed)
History:   KAMRY FARACI is a 20 y.o. G1P0000 at [redacted]w[redacted]d by ultrasound performed at 7w 1d being seen today for her first obstetrical visit.  Her obstetrical history is significant for elevated blood pressure reading without diagnosis of HTN, UTI. Patient does intend to breast feed. Pregnancy history fully reviewed.  Patient reports nausea and fatigue, both of which are improving as her pregnancy progresses.  This is an unplanned but highly desired pregnancy. Patient is scared and excited. She and her boyfriend/FOB Dorothea Ogle just moved out of their parents' homes and into a home together last November.      HISTORY: OB History  Gravida Para Term Preterm AB Living  1 0 0 0 0 0  SAB IAB Ectopic Multiple Live Births  0 0 0 0 0    # Outcome Date GA Lbr Len/2nd Weight Sex Delivery Anes PTL Lv  1 Current             No pap history  Past Medical History:  Diagnosis Date  . Depression    Past Surgical History:  Procedure Laterality Date  . WISDOM TOOTH EXTRACTION  04/02/2016   No family history on file. Social History   Tobacco Use  . Smoking status: Never Smoker  . Smokeless tobacco: Never Used  Substance Use Topics  . Alcohol use: No  . Drug use: No   Allergies  Allergen Reactions  . Flagyl [Metronidazole] Hives   Current Outpatient Medications on File Prior to Visit  Medication Sig Dispense Refill  . Prenatal Vit-Fe Fumarate-FA (MULTIVITAMIN-PRENATAL) 27-0.8 MG TABS tablet Take 1 tablet by mouth daily at 12 noon.    . clotrimazole (GYNE-LOTRIMIN) 1 % vaginal cream Place 1 Applicatorful vaginally at bedtime. (Patient not taking: Reported on 05/07/2020) 30 g 0  . Doxylamine-Pyridoxine (DICLEGIS) 10-10 MG TBEC Take 2 tablets by mouth at bedtime. If symptoms persist, add one tablet in the morning and one in the afternoon (Patient not taking: Reported on 05/07/2020) 100 tablet 5  . promethazine (PHENERGAN) 25 MG tablet Take 1 tablet (25 mg total) by mouth every 6 (six) hours as  needed for nausea or vomiting. (Patient not taking: Reported on 05/07/2020) 30 tablet 2   Current Facility-Administered Medications on File Prior to Visit  Medication Dose Route Frequency Provider Last Rate Last Admin  . medroxyPROGESTERone (DEPO-PROVERA) injection 150 mg  150 mg Intramuscular Q90 days Anyanwu, Ugonna A, MD   150 mg at 10/25/17 1326    Review of Systems Pertinent items noted in HPI and remainder of comprehensive ROS otherwise negative.  Physical Exam:   Vitals:   05/07/20 1434  BP: (!) 146/85  Pulse: 77  Weight: 147 lb (66.7 kg)   Fetal Heart Rate (bpm): 156 by Doppler  General: well-developed, well-nourished female in no acute distress  Breasts:  normal appearance, no masses or tenderness bilaterally  Skin: normal coloration and turgor, no rashes  Neurologic: oriented, normal, negative, normal mood  Extremities: normal strength, tone, and muscle mass, ROM of all joints is normal  HEENT PERRLA, extraocular movement intact and sclera clear, anicteric  Neck supple and no masses  Cardiovascular: regular rate and rhythm  Respiratory:  no respiratory distress, normal breath sounds  Abdomen: soft, non-tender; bowel sounds normal; no masses,  no organomegaly  Pelvic: normal external genitalia, no lesions, normal vaginal mucosa, normal vaginal discharge, normal cervix, pap smear done.     Assessment:    Pregnancy: G1P0000 Patient Active Problem List   Diagnosis Date Noted  .  Encounter for supervision of normal first pregnancy in first trimester 04/08/2020  . Oral contraception initial prescription 01/18/2018     Plan:    1. Encounter for supervision of normal first pregnancy in first trimester - LOB, routine care - CBC/D/Plt+RPR+Rh+ABO+Rub Ab... - [place] MFM OB COMP + 14 WK; Future - Genetic Screening - Cytology - PAP - Culture, OB Urine  2. Elevated blood pressure reading  - Protein / creatinine ratio, urine - Comp Met (CMET)   Initial labs  drawn. Continue prenatal vitamins. Problem list reviewed and updated. Genetic Screening discussed, First trimester screen, Quad screen and NIPS: ordered. Ultrasound discussed; fetal anatomic survey: ordered. Anticipatory guidance about prenatal visits given including labs, ultrasounds, and testing. Discussed usage of Babyscripts and virtual visits as additional source of managing and completing prenatal visits in midst of coronavirus and pandemic.   Encouraged to complete MyChart Registration for her ability to review results, send requests, and have questions addressed.  The nature of [place] [facility] with multiple MDs and Advanced Practice Providers was explained to patient; also emphasized that residents, students are part of our team. Routine obstetric precautions reviewed. Encouraged to seek out care at office or emergency room [facility] preferred) for urgent and/or emergent concerns. Return in about 4 weeks (around 06/04/2020).     Mallie Snooks, MSN, CNM Certified Nurse Midwife, [facility] for [employer], [facility] 05/07/20 9:32 PM

## 2020-05-08 LAB — CBC/D/PLT+RPR+RH+ABO+RUB AB...
Antibody Screen: NEGATIVE
Basophils Absolute: 0.1 10*3/uL (ref 0.0–0.2)
Basos: 1 %
EOS (ABSOLUTE): 0.1 10*3/uL (ref 0.0–0.4)
Eos: 1 %
HCV Ab: <0.1 s/co ratio (ref 0.0–0.9)
HIV Screen 4th Generation wRfx: NONREACTIVE
Hematocrit: 40.7 % (ref 34.0–46.6)
Hemoglobin: 13.8 g/dL (ref 11.1–15.9)
Hepatitis B Surface Ag: NEGATIVE
Immature Grans (Abs): 0 10*3/uL (ref 0.0–0.1)
Immature Granulocytes: 0 %
Lymphocytes Absolute: 2.9 10*3/uL (ref 0.7–3.1)
Lymphs: 28 %
MCH: 28.8 pg (ref 26.6–33.0)
MCHC: 33.9 g/dL (ref 31.5–35.7)
MCV: 85 fL (ref 79–97)
Monocytes Absolute: 0.9 10*3/uL (ref 0.1–0.9)
Monocytes: 9 %
Neutrophils Absolute: 6.4 10*3/uL (ref 1.4–7.0)
Neutrophils: 61 %
Platelets: 297 10*3/uL (ref 150–450)
RBC: 4.79 x10E6/uL (ref 3.77–5.28)
RDW: 13.5 % (ref 11.7–15.4)
RPR Ser Ql: NONREACTIVE
Rh Factor: POSITIVE
Rubella Antibodies, IGG: 1.85 index (ref >0.99)
WBC: 10.3 10*3/uL (ref 3.4–10.8)

## 2020-05-08 LAB — COMPREHENSIVE METABOLIC PANEL
ALT: 11 IU/L (ref 0–32)
AST: 19 IU/L (ref 0–40)
Albumin/Globulin Ratio: 1.9 (ref 1.2–2.2)
Albumin: 4.7 g/dL (ref 3.9–5.0)
Alkaline Phosphatase: 46 IU/L (ref 44–121)
BUN/Creatinine Ratio: 9 (ref 9–23)
BUN: 7 mg/dL (ref 6–20)
Bilirubin Total: 0.2 mg/dL (ref 0.0–1.2)
CO2: 19 mmol/L — ABNORMAL LOW (ref 20–29)
Calcium: 9.6 mg/dL (ref 8.7–10.2)
Chloride: 101 mmol/L (ref 96–106)
Creatinine, Ser: 0.8 mg/dL (ref 0.57–1.00)
Globulin, Total: 2.5 g/dL (ref 1.5–4.5)
Glucose: 82 mg/dL (ref 65–99)
Potassium: 4.1 mmol/L (ref 3.5–5.2)
Sodium: 137 mmol/L (ref 134–144)
Total Protein: 7.2 g/dL (ref 6.0–8.5)
eGFR: 107 mL/min/{1.73_m2} (ref >59)

## 2020-05-08 LAB — PROTEIN / CREATININE RATIO, URINE
Creatinine, Urine: 174.8 mg/dL
Protein, Ur: 12.7 mg/dL
Protein/Creat Ratio: 73 mg/g creat (ref 0–200)

## 2020-05-08 LAB — HCV INTERPRETATION

## 2020-05-09 ENCOUNTER — Other Ambulatory Visit: Payer: Self-pay | Admitting: *Deleted

## 2020-05-09 DIAGNOSIS — Z3401 Encounter for supervision of normal first pregnancy, first trimester: Secondary | ICD-10-CM

## 2020-05-09 LAB — CYTOLOGY - PAP
Chlamydia: NEGATIVE
Comment: NEGATIVE
Comment: NORMAL
Diagnosis: NEGATIVE
Neisseria Gonorrhea: NEGATIVE

## 2020-05-09 LAB — CULTURE, OB URINE

## 2020-05-09 LAB — URINE CULTURE, OB REFLEX

## 2020-05-15 ENCOUNTER — Telehealth: Payer: Self-pay | Admitting: Radiology

## 2020-05-15 ENCOUNTER — Encounter: Payer: Self-pay | Admitting: Radiology

## 2020-05-15 NOTE — Telephone Encounter (Signed)
Patient was informed of [employer] carrier screening results

## 2020-05-15 NOTE — Telephone Encounter (Signed)
Patient was informed of Panorama results did not want to know fetal sex 

## 2020-06-04 ENCOUNTER — Other Ambulatory Visit: Payer: Self-pay

## 2020-06-04 ENCOUNTER — Ambulatory Visit (INDEPENDENT_AMBULATORY_CARE_PROVIDER_SITE_OTHER): Payer: Medicaid Other | Admitting: Advanced Practice Midwife

## 2020-06-04 VITALS — BP 133/78 | HR 81 | Wt 148.2 lb

## 2020-06-04 DIAGNOSIS — Z3A15 15 weeks gestation of pregnancy: Secondary | ICD-10-CM

## 2020-06-04 DIAGNOSIS — Z3402 Encounter for supervision of normal first pregnancy, second trimester: Secondary | ICD-10-CM

## 2020-06-04 DIAGNOSIS — O2341 Unspecified infection of urinary tract in pregnancy, first trimester: Secondary | ICD-10-CM

## 2020-06-04 DIAGNOSIS — F419 Anxiety disorder, unspecified: Secondary | ICD-10-CM

## 2020-06-04 DIAGNOSIS — O99340 Other mental disorders complicating pregnancy, unspecified trimester: Secondary | ICD-10-CM

## 2020-06-04 MED ORDER — SERTRALINE HCL 25 MG PO TABS: 25.0000 mg | ORAL_TABLET | Freq: Every day | ORAL | 4 refills | Status: DC

## 2020-06-04 NOTE — Progress Notes (Signed)
GAD 7: 15 

## 2020-06-04 NOTE — Patient Instructions (Signed)

## 2020-06-05 NOTE — BH Specialist Note (Deleted)
Integrated Behavioral Health via Telemedicine Visit  06/05/2020 Stephanie Velazquez [id number]  Number of Integrated Behavioral Health visits: 1 Session Start time: 4:15***  Session End time: 5:15*** Total time: {IBH Total Time:21014050}  Referring Provider: *** Patient/Family location: Home*** [facility] Provider [facility]: [facility]  All persons participating in visit: Patient *** and [name] ***  Types of Service: {CHL AMB TYPE OF [phone]}  I connected with Tashala T Kilgore and/or Isella T Berke's {family members:20773} via  Telephone or Video Enabled Telemedicine Application  (Video is Caregility application) and verified that I am speaking with the correct person using two identifiers. Discussed confidentiality: {YES/NO:21197}  I discussed the limitations of telemedicine and the availability of in person appointments.  Discussed there is a possibility of technology failure and discussed alternative modes of communication if that failure occurs.  I discussed that engaging in this telemedicine visit, they consent to the provision of behavioral healthcare and the services will be billed under their insurance.  Patient and/or legal guardian expressed understanding and consented to Telemedicine visit: {YES/NO:21197}  Presenting Concerns: Patient and/or family reports the following symptoms/concerns: *** Duration of problem: ***; Severity of problem: {Mild/Moderate/Severe:20260}  Patient and/or Family's Strengths/Protective Factors: {CHL AMB BH PROTECTIVE FACTORS:[phone]}  Goals Addressed: Patient will: 1.  Reduce symptoms of: {IBH Symptoms:21014056}  2.  Increase knowledge and/or ability of: {IBH Patient Tools:21014057}  3.  Demonstrate ability to: {IBH Goals:21014053}  Progress towards Goals: {CHL AMB BH PROGRESS TOWARDS GOALS:[phone]}  Interventions: Interventions utilized:  {IBH  Interventions:21014054} Standardized Assessments completed: {IBH Screening Tools:21014051}  Patient and/or Family Response: ***  Assessment: Patient currently experiencing ***.   Patient may benefit from ***.  Plan: 1. Follow up with behavioral health clinician on : *** 2. Behavioral recommendations: *** 3. Referral(s): {IBH Referrals:21014055}  I discussed the assessment and treatment plan with the patient and/or parent/guardian. They were provided an opportunity to ask questions and all were answered. They agreed with the plan and demonstrated an understanding of the instructions.   They were advised to call back or seek an in-person evaluation if the symptoms worsen or if the condition fails to improve as anticipated.  Stephanie Velazquez Christe Tellez, LCSW

## 2020-06-05 NOTE — Progress Notes (Signed)
   PRENATAL VISIT NOTE  Subjective:  Stephanie Velazquez is a 20 y.o. G1P0000 at [redacted]w[redacted]d being seen today for ongoing prenatal care.  She is currently monitored for the following issues for this low-risk pregnancy and has Oral contraception initial prescription; Encounter for supervision of normal first pregnancy in first trimester; and UTI in pregnancy on their problem list.  Patient reports occasional sharp twinges across the top of her abdomen.  Contractions: Not present. Vag. Bleeding: None.   . Denies leaking of fluid. Denies dysuria, lower abdominal pain, fever.  Patient endorses worsening episodes of anxiety, especially during her work shifts. She reports having to "step away from everyone" to calm herself down. She denies chest pain, weakness, syncope. She was previously prescribed Zoloft for anxiety and depression and "felt better on it so I decided to stop it". She is "nervous" about medication, particularly new medication as she tends to have side effects that are more intense than predicted. She is not currently in therapy, not familiar with meditation of mindfulness exercises to mitigate episodes of anxiety.  The following portions of the patient's history were reviewed and updated as appropriate: allergies, current medications, past family history, past medical history, past social history, past surgical history and problem list. Problem list updated.  Objective:   Vitals:   06/04/20 1610  BP: 133/78  Pulse: 81  Weight: 148 lb 3.2 oz (67.2 kg)    Fetal Status: Fetal Heart Rate (bpm): 150         General:  Alert, oriented and cooperative. Patient is in no acute distress.  Skin: Skin is warm and dry. No rash noted.   Cardiovascular: Normal heart rate noted  Respiratory: Normal respiratory effort, no problems with respiration noted  Abdomen: Soft, gravid, appropriate for gestational age.  Pain/Pressure: Absent     Pelvic: Cervical exam deferred        Extremities: Normal range of  motion.     Mental Status: Normal mood and affect. Normal behavior. Normal judgment and thought content.   Assessment and Plan:  Pregnancy: G1P0000 at [redacted]w[redacted]d  1. Encounter for supervision of normal first pregnancy in second trimester - No pregnancy related concerns  2. Anxiety during pregnancy - Patient report of increased baseline level of anxiety, intermittently tearful during appt - Previously on Zoloft and "felt better".  - Restart Zoloft, explained importance of consistent use, do not advise spontaneous decision to discontinue medication - Advised journaling or listing feelings/moments which trigger episodes of severe anxiety - Ambulatory referral to Integrated Behavioral Health  3. Urinary tract infection in mother during first trimester of pregnancy - S/p normal urine culture collected previous visit  4. [redacted] weeks gestation of pregnancy   Preterm labor symptoms and general obstetric precautions including but not limited to vaginal bleeding, contractions, leaking of fluid and fetal movement were reviewed in detail with the patient. Please refer to After Visit Summary for other counseling recommendations.  Return in about 4 weeks (around 07/02/2020).  Future Appointments  Date Time Provider Department Center  06/09/2020  9:00 AM Gwyndolyn Saxon, LCSW [facility] None  07/03/2020  4:15 PM Reva Bores, MD [facility]  07/08/2020  2:15 PM [facility]  07/31/2020  4:15 PM [place] Bing, MD [facility]    Calvert Cantor, CNM

## 2020-06-09 ENCOUNTER — Encounter: Payer: Medicaid Other | Admitting: Licensed Clinical Social Worker

## 2020-06-09 NOTE — BH Specialist Note (Deleted)
Integrated Behavioral Health via Telemedicine Visit  06/09/2020 JYSSICA RIEF [id number]  Number of Integrated Behavioral Health visits: 1 Session Start time: 4:15***  Session End time: 5:15*** Total time: {IBH Total Time:21014050}  Referring Provider: *** Patient/Family location: Home*** [facility] Provider location: [facility]  All persons participating in visit: Patient *** and [facility] Ovide Dusek ***  Types of Service: {CHL AMB TYPE OF SERVICE:[phone]}  I connected with Cedra T Conery and/or Chris T Brillhart's {family members:20773} via  Telephone or Video Enabled Telemedicine Application  (Video is Caregility application) and verified that I am speaking with the correct person using two identifiers. Discussed confidentiality: {YES/NO:21197}  I discussed the limitations of telemedicine and the availability of in person appointments.  Discussed there is a possibility of technology failure and discussed alternative modes of communication if that failure occurs.  I discussed that engaging in this telemedicine visit, they consent to the provision of behavioral healthcare and the services will be billed under their insurance.  Patient and/or legal guardian expressed understanding and consented to Telemedicine visit: {YES/NO:21197}  Presenting Concerns: Patient and/or family reports the following symptoms/concerns: *** Duration of problem: ***; Severity of problem: {Mild/Moderate/Severe:20260}  Patient and/or Family's Strengths/Protective Factors: {CHL AMB BH PROTECTIVE FACTORS:[phone]}  Goals Addressed: Patient will: 1.  Reduce symptoms of: {IBH Symptoms:21014056}  2.  Increase knowledge and/or ability of: {IBH Patient Tools:21014057}  3.  Demonstrate ability to: {IBH Goals:21014053}  Progress towards Goals: {CHL AMB BH PROGRESS TOWARDS GOALS:[phone]}  Interventions: Interventions utilized:  {IBH  Interventions:21014054} Standardized Assessments completed: {IBH Screening Tools:21014051}  Patient and/or Family Response: ***  Assessment: Patient currently experiencing ***.   Patient may benefit from ***.  Plan: 1. Follow up with behavioral health clinician on : *** 2. Behavioral recommendations: *** 3. Referral(s): {IBH Referrals:21014055}  I discussed the assessment and treatment plan with the patient and/or parent/guardian. They were provided an opportunity to ask questions and all were answered. They agreed with the plan and demonstrated an understanding of the instructions.   They were advised to call back or seek an in-person evaluation if the symptoms worsen or if the condition fails to improve as anticipated.  Valetta Close Dalyn Kjos, LCSW

## 2020-06-16 ENCOUNTER — Encounter (HOSPITAL_COMMUNITY): Payer: Self-pay | Admitting: Family Medicine

## 2020-06-16 ENCOUNTER — Inpatient Hospital Stay (HOSPITAL_COMMUNITY)
Admission: AD | Admit: 2020-06-16 | Discharge: 2020-06-18 | DRG: 832 | Disposition: A | Discharge status: Home / Self Care | Payer: Medicaid Other | Attending: Obstetrics and Gynecology | Admitting: Obstetrics and Gynecology

## 2020-06-16 ENCOUNTER — Other Ambulatory Visit: Payer: Self-pay

## 2020-06-16 ENCOUNTER — Inpatient Hospital Stay (HOSPITAL_COMMUNITY): Payer: Medicaid Other

## 2020-06-16 ENCOUNTER — Telehealth: Payer: Self-pay

## 2020-06-16 DIAGNOSIS — O2302 Infections of kidney in pregnancy, second trimester: Principal | ICD-10-CM

## 2020-06-16 DIAGNOSIS — B951 Streptococcus, group B, as the cause of diseases classified elsewhere: Secondary | ICD-10-CM | POA: Diagnosis present

## 2020-06-16 DIAGNOSIS — Z87440 Personal history of urinary (tract) infections: Secondary | ICD-10-CM | POA: Diagnosis not present

## 2020-06-16 DIAGNOSIS — Z1629 Resistance to other single specified antibiotic: Secondary | ICD-10-CM | POA: Diagnosis present

## 2020-06-16 DIAGNOSIS — O99820 Streptococcus B carrier state complicating pregnancy: Secondary | ICD-10-CM | POA: Diagnosis not present

## 2020-06-16 DIAGNOSIS — M549 Dorsalgia, unspecified: Secondary | ICD-10-CM | POA: Diagnosis present

## 2020-06-16 DIAGNOSIS — Z20822 Contact with and (suspected) exposure to covid-19: Secondary | ICD-10-CM | POA: Diagnosis present

## 2020-06-16 DIAGNOSIS — Z1611 Resistance to penicillins: Secondary | ICD-10-CM | POA: Diagnosis present

## 2020-06-16 DIAGNOSIS — O2300 Infections of kidney in pregnancy, unspecified trimester: Secondary | ICD-10-CM | POA: Diagnosis present

## 2020-06-16 DIAGNOSIS — Z3A17 17 weeks gestation of pregnancy: Secondary | ICD-10-CM

## 2020-06-16 DIAGNOSIS — R8271 Bacteriuria: Secondary | ICD-10-CM | POA: Diagnosis present

## 2020-06-16 DIAGNOSIS — Z3401 Encounter for supervision of normal first pregnancy, first trimester: Principal | ICD-10-CM

## 2020-06-16 HISTORY — DX: Unspecified infectious disease: B99.9

## 2020-06-16 HISTORY — DX: Anxiety disorder, unspecified: F41.9

## 2020-06-16 HISTORY — DX: Headache, unspecified: R51.9

## 2020-06-16 LAB — COMPREHENSIVE METABOLIC PANEL
ALT: 28 U/L (ref 0–44)
AST: 28 U/L (ref 15–41)
Albumin: 3.5 g/dL (ref 3.5–5.0)
Alkaline Phosphatase: 43 U/L (ref 38–126)
Anion gap: 11 (ref 5–15)
BUN: 6 mg/dL (ref 6–20)
CO2: 19 mmol/L — ABNORMAL LOW (ref 22–32)
Calcium: 8.7 mg/dL — ABNORMAL LOW (ref 8.9–10.3)
Chloride: 103 mmol/L (ref 98–111)
Creatinine, Ser: 0.7 mg/dL (ref 0.44–1.00)
GFR, Estimated: >60 mL/min (ref >60)
Glucose, Bld: 91 mg/dL (ref 70–99)
Potassium: 3.4 mmol/L — ABNORMAL LOW (ref 3.5–5.1)
Sodium: 133 mmol/L — ABNORMAL LOW (ref 135–145)
Total Bilirubin: 0.7 mg/dL (ref 0.3–1.2)
Total Protein: 6.6 g/dL (ref 6.5–8.1)

## 2020-06-16 LAB — CBC
HCT: 34.3 % — ABNORMAL LOW (ref 36.0–46.0)
Hemoglobin: 11.5 g/dL — ABNORMAL LOW (ref 12.0–15.0)
MCH: 28.7 pg (ref 26.0–34.0)
MCHC: 33.5 g/dL (ref 30.0–36.0)
MCV: 85.5 fL (ref 80.0–100.0)
Platelets: 245 10*3/uL (ref 150–400)
RBC: 4.01 MIL/uL (ref 3.87–5.11)
RDW: 13.2 % (ref 11.5–15.5)
WBC: 14.5 10*3/uL — ABNORMAL HIGH (ref 4.0–10.5)
nRBC: 0 % (ref 0.0–0.2)

## 2020-06-16 LAB — URINALYSIS, ROUTINE W REFLEX MICROSCOPIC
Bilirubin Urine: NEGATIVE
Glucose, UA: NEGATIVE mg/dL
Hgb urine dipstick: NEGATIVE
Ketones, ur: 80 mg/dL — AB
Nitrite: NEGATIVE
Protein, ur: NEGATIVE mg/dL
Specific Gravity, Urine: 1.019 (ref 1.005–1.030)
pH: 6 (ref 5.0–8.0)

## 2020-06-16 LAB — TYPE AND SCREEN
ABO/RH(D): AB POS
Antibody Screen: NEGATIVE

## 2020-06-16 LAB — RESP PANEL BY RT-PCR (FLU A&B, COVID) ARPGX2
Influenza A by PCR: NEGATIVE
Influenza B by PCR: NEGATIVE
SARS Coronavirus 2 by RT PCR: NEGATIVE

## 2020-06-16 MED ORDER — SODIUM CHLORIDE 0.9 % IV SOLN
2.0000 g | INTRAVENOUS | Status: DC
  Administered 2020-06-16 – 2020-06-18 (×3): 2 g via INTRAVENOUS
  Filled 2020-06-16 (×3): qty 20

## 2020-06-16 MED ORDER — ACETAMINOPHEN 500 MG PO TABS: 500.0000 mg | ORAL_TABLET | Freq: Four times a day (QID) | ORAL | Status: DC | PRN

## 2020-06-16 MED ORDER — ENOXAPARIN SODIUM 40 MG/0.4ML ~~LOC~~ SOLN: 40.0000 mg | SUBCUTANEOUS | Status: DC

## 2020-06-16 MED ORDER — ACETAMINOPHEN 325 MG PO TABS
650.0000 mg | ORAL_TABLET | Freq: Once | ORAL | Status: AC
  Administered 2020-06-16: 650 mg via ORAL
  Filled 2020-06-16: qty 2

## 2020-06-16 MED ORDER — POTASSIUM CHLORIDE CRYS ER 20 MEQ PO TBCR
20.0000 meq | EXTENDED_RELEASE_TABLET | Freq: Two times a day (BID) | ORAL | Status: AC
  Administered 2020-06-16 – 2020-06-18 (×4): 20 meq via ORAL
  Filled 2020-06-16 (×4): qty 1

## 2020-06-16 MED ORDER — PRENATAL MULTIVITAMIN CH: 1.0000 | ORAL_TABLET | Freq: Every day | ORAL | Status: DC

## 2020-06-16 MED ORDER — SODIUM CHLORIDE 0.9 % IV SOLN: INTRAVENOUS | Status: DC

## 2020-06-16 MED ORDER — CALCIUM CARBONATE ANTACID 500 MG PO CHEW: 2.0000 | CHEWABLE_TABLET | ORAL | Status: DC | PRN

## 2020-06-16 MED ORDER — PROMETHAZINE HCL 25 MG PO TABS: 25.0000 mg | ORAL_TABLET | Freq: Four times a day (QID) | ORAL | Status: DC | PRN

## 2020-06-16 MED ORDER — SERTRALINE HCL 50 MG PO TABS
25.0000 mg | ORAL_TABLET | Freq: Every day | ORAL | Status: DC
  Administered 2020-06-16 – 2020-06-17 (×2): 25 mg via ORAL
  Filled 2020-06-16 (×2): qty 1

## 2020-06-16 MED ORDER — ACETAMINOPHEN 325 MG PO TABS
650.0000 mg | ORAL_TABLET | ORAL | Status: DC | PRN
  Administered 2020-06-16 – 2020-06-18 (×8): 650 mg via ORAL
  Filled 2020-06-16 (×8): qty 2

## 2020-06-16 MED ORDER — ZOLPIDEM TARTRATE 5 MG PO TABS: 5.0000 mg | ORAL_TABLET | Freq: Every evening | ORAL | Status: DC | PRN

## 2020-06-16 MED ORDER — DOCUSATE SODIUM 100 MG PO CAPS: 100.0000 mg | ORAL_CAPSULE | Freq: Every day | ORAL | Status: DC

## 2020-06-16 NOTE — MAU Provider Note (Signed)
History   Chief Complaint:  Back Pain, Fever, and Nausea   Stephanie Velazquez is  20 y.o. G1P0000 with history of anxiety, depression and recurrent UTIs. She is [redacted]w[redacted]d by early ultrasound.  She presents complaining of Back Pain, Fever, and Nausea  She reports onset of right lower back pain two days ago. She states the pain resolved after rest but then returned last night at 8:30 PM and has remained constant since. She rates the pain a 3/10 at rest, and intermittent sharp cramps she rates 7/10. She has nausea associated with this. She had first trimester nausea that resolved at 12 weeks. She denies vomiting or diarrhea. She does have mild constipation. She denies dysuria or hematuria but does endorse urinary frequency. She denies vaginal bleeding, discharge, itching or contractions. She denies abdominal pain. She also reports feeling more thirsty today despite adequate PO intake. She took 1g Tylenol with mild improvement. She denies similar symptoms previously. Pain is worse with movement and certain positions. She reports a history of UTI in the past. Denies history of kidney stones. Denies history of diabetes.   OB History    Gravida  1   Para  0   Term  0   Preterm  0   AB  0   Living  0     SAB  0   IAB  0   Ectopic  0   Multiple  0   Live Births  0            Past Medical History:  Diagnosis Date  . Anxiety   . Depression    Just started back on meds  . Headache   . Infection    UTI    Past Surgical History:  Procedure Laterality Date  . WISDOM TOOTH EXTRACTION  04/02/2016    Family History  Problem Relation Age of Onset  . Healthy Father   . Healthy Mother     Social History   Tobacco Use  . Smoking status: Never Smoker  . Smokeless tobacco: Never Used  Vaping Use  . Vaping Use: Never used  Substance Use Topics  . Alcohol use: No  . Drug use: No    Allergies:  Allergies  Allergen Reactions  . Flagyl [Metronidazole] Hives  . Monistat  [Miconazole] Hives    Burning inside    Facility-Administered Medications Prior to Admission  Medication Dose Route Frequency Provider Last Rate Last Admin  . [DISCONTINUED] medroxyPROGESTERone (DEPO-PROVERA) injection 150 mg  150 mg Intramuscular Q90 days Anyanwu, Ugonna A, MD   150 mg at 10/25/17 1326   Medications Prior to Admission  Medication Sig Dispense Refill Last Dose  . acetaminophen (TYLENOL) 500 MG tablet Take 500 mg by mouth every 6 (six) hours as needed for moderate pain or fever.    at 0300  . Prenatal Vit-Fe Fumarate-FA (MULTIVITAMIN-PRENATAL) 27-0.8 MG TABS tablet Take 1 tablet by mouth daily at 12 noon.   06/15/2020 at Unknown time  . sertraline (ZOLOFT) 25 MG tablet Take 1 tablet (25 mg total) by mouth daily. 30 tablet 4 06/15/2020 at Unknown time  . clotrimazole (GYNE-LOTRIMIN) 1 % vaginal cream Place 1 Applicatorful vaginally at bedtime. (Patient not taking: No sig reported) 30 g 0   . Doxylamine-Pyridoxine (DICLEGIS) 10-10 MG TBEC Take 2 tablets by mouth at bedtime. If symptoms persist, add one tablet in the morning and one in the afternoon (Patient not taking: No sig reported) 100 tablet 5   . promethazine (PHENERGAN)  25 MG tablet Take 1 tablet (25 mg total) by mouth every 6 (six) hours as needed for nausea or vomiting. (Patient not taking: No sig reported) 30 tablet 2     Review of Systems - Negative except for those listed in the HPI  Physical Exam   Blood pressure 120/69, pulse 81, temperature (!) 101.2 F (38.4 C), temperature source Oral, resp. rate 18, height 5\' 8"  (1.727 m), weight 66.8 kg, SpO2 99 %.  General: General appearance - alert, mildly ill appearing  Heart - normal rate and regular rhythm, no murmurs noted Abdomen - soft, nontender, nondistended, no masses or organomegaly Back exam - +right CVA tenderness Extremities - no pedal edema noted Skin - normal coloration and turgor, no rashes, no suspicious skin lesions noted  Labs: Results for orders  placed or performed during the hospital encounter of 06/16/20 (from the past 24 hour(s))  Urinalysis, Routine w reflex microscopic Urine, Clean Catch   Collection Time: 06/16/20  9:08 AM  Result Value Ref Range   Color, Urine YELLOW YELLOW   APPearance HAZY (A) CLEAR   Specific Gravity, Urine 1.019 1.005 - 1.030   pH 6.0 5.0 - 8.0   Glucose, UA NEGATIVE NEGATIVE mg/dL   Hgb urine dipstick NEGATIVE NEGATIVE   Bilirubin Urine NEGATIVE NEGATIVE   Ketones, ur 80 (A) NEGATIVE mg/dL   Protein, ur NEGATIVE NEGATIVE mg/dL   Nitrite NEGATIVE NEGATIVE   Leukocytes,Ua TRACE (A) NEGATIVE   RBC / HPF 0-5 0 - 5 RBC/hpf   WBC, UA 11-20 0 - 5 WBC/hpf   Bacteria, UA MANY (A) NONE SEEN   Squamous Epithelial / LPF 21-50 0 - 5   Mucus PRESENT   CBC   Collection Time: 06/16/20  9:36 AM  Result Value Ref Range   WBC 14.5 (H) 4.0 - 10.5 K/uL   RBC 4.01 3.87 - 5.11 MIL/uL   Hemoglobin 11.5 (L) 12.0 - 15.0 g/dL   HCT 06/18/20 (L) 84.6 - 96.2 %   MCV 85.5 80.0 - 100.0 fL   MCH 28.7 26.0 - 34.0 pg   MCHC 33.5 30.0 - 36.0 g/dL   RDW 95.2 84.1 - 32.4 %   Platelets 245 150 - 400 K/uL   nRBC 0.0 0.0 - 0.2 %  Comprehensive metabolic panel   Collection Time: 06/16/20  9:36 AM  Result Value Ref Range   Sodium 133 (L) 135 - 145 mmol/L   Potassium 3.4 (L) 3.5 - 5.1 mmol/L   Chloride 103 98 - 111 mmol/L   CO2 19 (L) 22 - 32 mmol/L   Glucose, Bld 91 70 - 99 mg/dL   BUN 6 6 - 20 mg/dL   Creatinine, Ser 06/18/20 0.44 - 1.00 mg/dL   Calcium 8.7 (L) 8.9 - 10.3 mg/dL   Total Protein 6.6 6.5 - 8.1 g/dL   Albumin 3.5 3.5 - 5.0 g/dL   AST 28 15 - 41 U/L   ALT 28 0 - 44 U/L   Alkaline Phosphatase 43 38 - 126 U/L   Total Bilirubin 0.7 0.3 - 1.2 mg/dL   GFR, Estimated 0.27 >25 mL/min   Anion gap 11 5 - 15     Assessment: Patient Active Problem List   Diagnosis Date Noted  . Pyelonephritis affecting pregnancy 06/16/2020  . UTI in pregnancy 05/07/2020  . Encounter for supervision of normal first pregnancy in first  trimester 04/08/2020  . Oral contraception initial prescription 01/18/2018   This is a 20 y.o. female at 107 weeks  gestation with history of recurrent UTIs who presents for 2 days of right lower back pain, fever and nausea. She has a low grade fever but non-tachycardic and normotensive. She is uncomfortable appearing on exam with moderate right CVA tenderness. Given history and symptoms consistent with pyelonephritis, she was treated empirically with IV rocephin. UA returned with many bacteria. CBC notable for leukocytosis. Labs consistent with pyelonephritis. Admitting team consulted for further observation and care. She is in stable condition.   Plan: 1. Suspected pyelonephritis in pregnancy: elevated WBC and bacteria on UA  - Given low grade fever and symptoms concerning for pyelonephritis, empirically started on IV rocephin  - Tylenol for pain  - Urine culture pending  - Admit for management and care: Resp panel and tyle and screen ordered   2. Low risk pregnancy, 17 weeks - No VB, LOF or signs concerning for early labor. Continue with OB f/u as scheduled    Dot Lanes 06/16/2020, 12:32 PM

## 2020-06-16 NOTE — H&P (Signed)
Faculty Practice Antenatal History and Physical  JESSALYN HINOJOSA GUR:[id number] DOB: [birth year] DOA: 06/16/2020  Chief Complaint: Back pain, fever  HPI: Stephanie Velazquez is a 20 y.o. female G1P0000 with IUP at [redacted]w[redacted]d presenting for nonradiating, right sided back pain that started 2 days ago. It seemed to get better yesterday, then worse again this morning. Fever started earlier today. Initially, pain worse with movement, but now steady. No other palliating or provoking factors.  Has a history of frequent UTIs. No dysuria. May have some frequency. Nausea and vomiting some.   Review of Systems:    Pt denies any chest pain, SOB, abdominal pain.  Review of systems are otherwise negative  Prenatal History/Complications: Anxiety/depression  Past Medical History: Past Medical History:  Diagnosis Date  . Anxiety   . Depression    Just started back on meds  . Headache   . Infection    UTI    Past Surgical History: Past Surgical History:  Procedure Laterality Date  . WISDOM TOOTH EXTRACTION  04/02/2016    Obstetrical History: OB History    Gravida  1   Para  0   Term  0   Preterm  0   AB  0   Living  0     SAB  0   IAB  0   Ectopic  0   Multiple  0   Live Births  0           Gynecological History: OB History    Gravida  1   Para  0   Term  0   Preterm  0   AB  0   Living  0     SAB  0   IAB  0   Ectopic  0   Multiple  0   Live Births  0           Social History: Social History   Socioeconomic History  . Marital status: Single    Spouse name: Not on file  . Number of children: Not on file  . Years of education: Not on file  . Highest education level: Not on file  Occupational History  . Not on file  Tobacco Use  . Smoking status: Never Smoker  . Smokeless tobacco: Never Used  Vaping Use  . Vaping Use: Never used  Substance and Sexual Activity  . Alcohol use: No  . Drug use: No  . Sexual activity: Yes    Partners:  Male    Birth control/protection: Pill  Other Topics Concern  . Not on file  Social History Narrative  . Not on file   Social Determinants of Health   Financial Resource Strain: Not on file  Food Insecurity: Not on file  Transportation Needs: Not on file  Physical Activity: Not on file  Stress: Not on file  Social Connections: Not on file    Family History: Family History  Problem Relation Age of Onset  . Healthy Father   . Healthy Mother     Allergies: Allergies  Allergen Reactions  . Flagyl [Metronidazole] Hives  . Monistat [Miconazole] Hives    Burning inside    Facility-Administered Medications Prior to Admission  Medication Dose Route Frequency Provider Last Rate Last Admin  . [DISCONTINUED] medroxyPROGESTERone (DEPO-PROVERA) injection 150 mg  150 mg Intramuscular Q90 days Anyanwu, Ugonna A, MD   150 mg at 10/25/17 1326   Medications Prior to Admission  Medication Sig Dispense Refill Last Dose  .  acetaminophen (TYLENOL) 500 MG tablet Take 500 mg by mouth every 6 (six) hours as needed for moderate pain or fever.    at 0300  . Prenatal Vit-Fe Fumarate-FA (MULTIVITAMIN-PRENATAL) 27-0.8 MG TABS tablet Take 1 tablet by mouth daily at 12 noon.   06/15/2020 at Unknown time  . sertraline (ZOLOFT) 25 MG tablet Take 1 tablet (25 mg total) by mouth daily. 30 tablet 4 06/15/2020 at Unknown time  . clotrimazole (GYNE-LOTRIMIN) 1 % vaginal cream Place 1 Applicatorful vaginally at bedtime. (Patient not taking: No sig reported) 30 g 0   . Doxylamine-Pyridoxine (DICLEGIS) 10-10 MG TBEC Take 2 tablets by mouth at bedtime. If symptoms persist, add one tablet in the morning and one in the afternoon (Patient not taking: No sig reported) 100 tablet 5   . promethazine (PHENERGAN) 25 MG tablet Take 1 tablet (25 mg total) by mouth every 6 (six) hours as needed for nausea or vomiting. (Patient not taking: No sig reported) 30 tablet 2     Physical Exam: BP 120/69   Pulse 81   Temp (!) 101.2  F (38.4 C) (Oral)   Resp 18   Ht 5\' 8"  (1.727 m)   Wt 66.8 kg   LMP  (LMP Unknown)   SpO2 99%   BMI 22.40 kg/m   BP 120/69   Pulse 81   Temp (!) 101.2 F (38.4 C) (Oral)   Resp 18   Ht 5\' 8"  (1.727 m)   Wt 66.8 kg   LMP  (LMP Unknown)   SpO2 99%   BMI 22.40 kg/m  General appearance: alert, cooperative and no distress Head: Normocephalic, without obvious abnormality, atraumatic Eyes: PERRL, EOMI Neck: no adenopathy, no carotid bruit, no JVD, supple, symmetrical, trachea midline and thyroid not enlarged, symmetric, no tenderness/mass/nodules Lungs: clear to auscultation bilaterally Heart: regular rate and rhythm, S1, S2 normal, no murmur, click, rub or gallop Abdomen: soft, non-tender; bowel sounds normal; no masses,  no organomegaly Extremities: extremities normal, atraumatic, no cyanosis or edema Pulses: 2+ and symmetric Skin: Skin color, texture, turgor normal. No rashes or lesions Neurologic: Alert and oriented X 3, normal strength and tone. Normal symmetric reflexes. Normal coordination and gait cephalic             Labs on Admission:  Basic Metabolic Panel: Recent Labs  Lab 06/16/20 0936  NA 133*  K 3.4*  CL 103  CO2 19*  GLUCOSE 91  BUN 6  CREATININE 0.70  CALCIUM 8.7*   Liver Function Tests: Recent Labs  Lab 06/16/20 0936  AST 28  ALT 28  ALKPHOS 43  BILITOT 0.7  PROT 6.6  ALBUMIN 3.5   No results for input(s): LIPASE, AMYLASE in the last 168 hours. No results for input(s): AMMONIA in the last 168 hours. CBC: Recent Labs  Lab 06/16/20 0936  WBC 14.5*  HGB 11.5*  HCT 34.3*  MCV 85.5  PLT 245    CBG: No results for input(s): GLUCAP in the last 168 hours.  Radiological Exams on Admission: No results found.   Assessment/Plan Active Problems:   Pyelonephritis affecting pregnancy  Admit Rocephin IVF Check renal 06/18/20 Urine culture pending  Code Status: full  Family Communication: mother present   06/18/20,  DO 06/16/2020 12:30 PM Faculty Practice Attending Physician [facility], [facility]

## 2020-06-16 NOTE — Telephone Encounter (Signed)
Pt called stating she had a temperature of 99.8, severe right back pain, and her skin is "tender". Pt denies vaginal bleeding or leakage of fluid. Pt advised to go to the MAU to be evaluated.

## 2020-06-16 NOTE — MAU Note (Signed)
Pain in rt lower back, started 2 days ago, when she woke up, it was gone, but then it came back, not as bad as last night. Low grade fever 99.8. some nausea.  Hurts (same area) really bad when she breaths in, takes a deep breath.

## 2020-06-16 NOTE — MAU Note (Signed)
+  RT CVA tenderness 

## 2020-06-17 DIAGNOSIS — Z3A17 17 weeks gestation of pregnancy: Secondary | ICD-10-CM

## 2020-06-17 DIAGNOSIS — O2302 Infections of kidney in pregnancy, second trimester: Principal | ICD-10-CM

## 2020-06-17 LAB — CBC
HCT: 31.9 % — ABNORMAL LOW (ref 36.0–46.0)
Hemoglobin: 10.8 g/dL — ABNORMAL LOW (ref 12.0–15.0)
MCH: 29.2 pg (ref 26.0–34.0)
MCHC: 33.9 g/dL (ref 30.0–36.0)
MCV: 86.2 fL (ref 80.0–100.0)
Platelets: 220 10*3/uL (ref 150–400)
RBC: 3.7 MIL/uL — ABNORMAL LOW (ref 3.87–5.11)
RDW: 13.4 % (ref 11.5–15.5)
WBC: 13.6 10*3/uL — ABNORMAL HIGH (ref 4.0–10.5)
nRBC: 0 % (ref 0.0–0.2)

## 2020-06-17 NOTE — Progress Notes (Signed)
Daily Antepartum Note  Admission Date: 06/16/2020 Current Date: 06/17/2020 9:00 AM  Stephanie Velazquez is a 20 y.o. G1P0000 @ [redacted]w[redacted]d, HD#2, admitted for right sided pyelo.  Pregnancy complicated by: Patient Active Problem List   Diagnosis Date Noted  . Pyelonephritis affecting pregnancy 06/16/2020  . UTI in pregnancy 05/07/2020  . Encounter for supervision of normal first pregnancy in first trimester 04/08/2020    Overnight/24hr events:  Temp overnight  Subjective:  Still with right sided pain but feeling better. No OB or constitutional s/s.   Objective:    Current Vital Signs 24h Vital Sign Ranges  T 98 F (36.7 C) Temp  Avg: 99.7 F (37.6 C)  Min: 98 F (36.7 C)  Max: 101.2 F (38.4 C)  BP (!) 106/51 BP  Min: 106/51  Max: 134/61  HR 90 Pulse  Avg: 90.1  Min: 81  Max: 97  RR 16 Resp  Avg: 17.8  Min: 16  Max: 20  SaO2 99 % Room Air SpO2  Avg: 97.1 %  Min: 95 %  Max: 100 %       24 Hour I/O Current Shift I/O  Time Ins Outs 04/25 0701 - 04/26 0700 In: -  Out: 1000 [Urine:1000] No intake/output data recorded.   Patient Vitals for the past 24 hrs:  BP Temp Temp src Pulse Resp SpO2 Height Weight  06/17/20 0823 (!) 106/51 98 F (36.7 C) Oral 90 16 99 % -- --  06/17/20 0551 (!) 116/59 98.4 F (36.9 C) Oral 91 16 98 % -- --  06/17/20 0550 -- -- -- -- -- 98 % -- --  06/17/20 0545 -- -- -- -- -- 97 % -- --  06/17/20 0540 -- -- -- -- -- 98 % -- --  06/17/20 0535 -- -- -- -- -- 96 % -- --  06/17/20 0530 -- -- -- -- -- 96 % -- --  06/17/20 0525 -- -- -- -- -- 96 % -- --  06/17/20 0520 -- -- -- -- -- 96 % -- --  06/17/20 0515 -- -- -- -- -- 96 % -- --  06/17/20 0510 -- -- -- -- -- 95 % -- --  06/17/20 0505 -- -- -- -- -- 96 % -- --  06/17/20 0500 -- -- -- -- -- 96 % -- --  06/17/20 0455 -- -- -- -- -- 97 % -- --  06/17/20 0450 -- -- -- -- -- 96 % -- --  06/17/20 0445 -- -- -- -- -- 96 % -- --  06/17/20 0440 -- -- -- -- -- 96 % -- --  06/17/20 0435 -- -- -- -- -- 97 % -- --   06/17/20 0430 -- -- -- -- -- 97 % -- --  06/17/20 0425 -- -- -- -- -- 97 % -- --  06/17/20 0420 -- -- -- -- -- 97 % -- --  06/17/20 0415 -- -- -- -- -- 97 % -- --  06/17/20 0410 -- -- -- -- -- 97 % -- --  06/17/20 0405 -- -- -- -- -- 97 % -- --  06/17/20 0400 -- -- -- -- -- 97 % -- --  06/17/20 0355 -- -- -- -- -- 97 % -- --  06/17/20 0350 -- -- -- -- -- 97 % -- --  06/17/20 0346 -- -- -- -- -- 97 % -- --  06/17/20 0345 -- -- -- -- -- 97 % -- --  06/17/20 0340 -- -- -- -- --  97 % -- --  06/16/20 2340 (!) 116/53 98.6 F (37 C) Oral 97 -- 97 % -- --  06/16/20 2023 -- 99.7 F (37.6 C) Oral -- -- -- -- --  06/16/20 1933 (!) 113/50 (!) 101 F (38.3 C) Oral 94 -- -- -- --  06/16/20 1718 -- (!) 100.4 F (38 C) Oral -- -- -- -- --  06/16/20 1646 129/70 99.6 F (37.6 C) Oral 88 19 -- -- --  06/16/20 1322 134/61 100.1 F (37.8 C) Oral 92 20 100 % -- --  06/16/20 1255 (!) 116/56 99.5 F (37.5 C) Oral 90 -- -- -- --  06/16/20 1152 -- (!) 101.2 F (38.4 C) Oral -- -- -- -- --  06/16/20 0940 120/69 -- -- 81 -- 99 % -- --  06/16/20 0925 -- -- -- -- -- 100 % -- --  06/16/20 0920 -- -- -- -- -- 100 % -- --  06/16/20 0919 (!) 125/55 100.2 F (37.9 C) Oral 88 18 -- -- --  06/16/20 0905 -- -- -- -- -- -- 5\' 8"  (1.727 m) 66.8 kg   Fetal Dopplers: 150s  Physical exam: General: Well nourished, well developed female in no acute distress. Abdomen: gravid nttp Back: +right CVAT Cardiovascular: S1, S2 normal, no murmur, rub or gallop, regular rate and rhythm Respiratory: CTAB Extremities: no clubbing, cyanosis or edema Skin: Warm and dry.   Medications: Current Facility-Administered Medications  Medication Dose Route Frequency Provider Last Rate Last Admin  . 0.9 %  sodium chloride infusion   Intravenous Continuous , DO 100 mL/hr at 06/17/20 0826 New Bag at 06/17/20 0826  . acetaminophen (TYLENOL) tablet 650 mg  650 mg Oral Q4H PRN 06/19/20, DO   650 mg at 06/17/20  0825  . calcium carbonate (TUMS - dosed in mg elemental calcium) chewable tablet 400 mg of elemental calcium  2 tablet Oral Q4H PRN 06/19/20, DO      . cefTRIAXone (ROCEPHIN) 2 g in sodium chloride 0.9 % 100 mL IVPB  2 g Intravenous Q24H Levie Heritage, DO 200 mL/hr at 06/16/20 1102 2 g at 06/16/20 1102  . docusate sodium (COLACE) capsule 100 mg  100 mg Oral Daily 06/18/20, DO      . enoxaparin (LOVENOX) injection 40 mg  40 mg Subcutaneous Q24H Stinson, Jacob J, DO      . potassium chloride SA (KLOR-CON) CR tablet 20 mEq  20 mEq Oral BID AC 12-17-1976, MD   20 mEq at 06/17/20 0828  . prenatal multivitamin tablet 1 tablet  1 tablet Oral Q1200 06/19/20, DO      . promethazine (PHENERGAN) tablet 25 mg  25 mg Oral Q6H PRN Levie Heritage, DO      . sertraline (ZOLOFT) tablet 25 mg  25 mg Oral Daily Levie Heritage, DO   25 mg at 06/16/20 2024  . zolpidem (AMBIEN) tablet 5 mg  5 mg Oral QHS PRN 2025, DO        Labs:  Pending: UCx  Recent Labs  Lab 06/16/20 0936 06/17/20 0539  WBC 14.5* 13.6*  HGB 11.5* 10.8*  HCT 34.3* 31.9*  PLT 245 220    Recent Labs  Lab 06/16/20 0936  NA 133*  K 3.4*  CL 103  CO2 19*  BUN 6  CREATININE 0.70  CALCIUM 8.7*  PROT 6.6  BILITOT 0.7  ALKPHOS 43  ALT 28  AST 28  GLUCOSE 91   Radiology:  Narrative & Impression  CLINICAL DATA:  Pyelonephritis. Pregnant patient at [redacted] weeks gestation.  EXAM: RENAL / URINARY TRACT ULTRASOUND COMPLETE  COMPARISON:  None.  FINDINGS: Right Kidney:  Renal measurements: 10.3 x 5.3 x 5.3 cm = volume: 151 mL. Minimal hydronephrosis. No focal fluid collection, renal lesion or stone. Normal renal parenchymal echogenicity.  Left Kidney:  Renal measurements: 10.5 x 5.9 x 4.6 cm = volume: 150 mL. No hydronephrosis. Normal renal parenchymal echogenicity. No focal lesion or stone. No fluid collection.  Bladder:  Appears normal for degree of bladder  distention.  Other:  Gravid uterus partially visualized.  IMPRESSION: 1. Minimal right hydronephrosis. 2. No renal fluid collection, focal abnormality, or stone.   Electronically Signed   By: Narda Rutherford M.D.   On: 06/16/2020 15:22   Assessment & Plan:  Pt improving *Pregnancy: fetal status reassuring. qday dopplers *ID: follow up UCx. Continue rocephin D#2. NOB ucx was resistant to macrobid and amp *Preterm: no current issues *PPx: lovenox qday *FEN/GI: regular diet, IVF bolus remaining fluids and SLIV *Dispo: possible d/c late tomorrow, more likely 4/28  Cornelia Copa MD Attending Center for [facility] (Faculty Practice) GYN Consult Phone: [phone] (M-F, [phone]) & [phone] (Off hours, weekends, holidays)

## 2020-06-18 DIAGNOSIS — R8271 Bacteriuria: Secondary | ICD-10-CM | POA: Diagnosis present

## 2020-06-18 DIAGNOSIS — O99820 Streptococcus B carrier state complicating pregnancy: Secondary | ICD-10-CM

## 2020-06-18 LAB — CBC
HCT: 31.4 % — ABNORMAL LOW (ref 36.0–46.0)
Hemoglobin: 10.6 g/dL — ABNORMAL LOW (ref 12.0–15.0)
MCH: 29 pg (ref 26.0–34.0)
MCHC: 33.8 g/dL (ref 30.0–36.0)
MCV: 86 fL (ref 80.0–100.0)
Platelets: 229 10*3/uL (ref 150–400)
RBC: 3.65 MIL/uL — ABNORMAL LOW (ref 3.87–5.11)
RDW: 13.6 % (ref 11.5–15.5)
WBC: 13.2 10*3/uL — ABNORMAL HIGH (ref 4.0–10.5)
nRBC: 0 % (ref 0.0–0.2)

## 2020-06-18 LAB — CULTURE, OB URINE: Culture: 80000 — AB

## 2020-06-18 MED ORDER — CEPHALEXIN 500 MG PO CAPS: 500.0000 mg | ORAL_CAPSULE | Freq: Every day | ORAL | 3 refills | Status: DC

## 2020-06-18 MED ORDER — SULFAMETHOXAZOLE-TRIMETHOPRIM 800-160 MG PO TABS
1.0000 | ORAL_TABLET | Freq: Two times a day (BID) | ORAL | Status: DC
  Administered 2020-06-18: 1 via ORAL
  Filled 2020-06-18: qty 1

## 2020-06-18 MED ORDER — CEPHALEXIN 500 MG PO CAPS: 500.0000 mg | ORAL_CAPSULE | Freq: Every day | ORAL | Status: DC

## 2020-06-18 MED ORDER — SULFAMETHOXAZOLE-TRIMETHOPRIM 400-80 MG PO TABS: 1.0000 | ORAL_TABLET | Freq: Two times a day (BID) | ORAL | Status: DC

## 2020-06-18 MED ORDER — SULFAMETHOXAZOLE-TRIMETHOPRIM 800-160 MG PO TABS: 1.0000 | ORAL_TABLET | Freq: Two times a day (BID) | ORAL | 0 refills | Status: AC

## 2020-06-18 NOTE — Discharge Instructions (Signed)
Once you finish the Bactrim DS, start the Keflex at night   Pyelonephritis During Pregnancy  What are the causes? This condition is caused by a bacterial infection in the lower urinary tract that spreads to the kidney. What increases the risk? You are more likely to develop this condition if:  You have diabetes.  You have a history of frequent urinary tract infections. What are the signs or symptoms? Symptoms of this condition may begin with symptoms of a lower urinary tract infection. These may include:  A frequent urge to pass urine.  Burning pain when passing urine.  Pain and pressure in your lower abdomen.  Blood in your urine.  Cloudy or smelly urine. As the infection spreads to your kidney, you may have these symptoms:  Fever.  Chills.  Pain and tenderness in your upper abdomen or in your back and sides (flank pain). Flank pain often affects one side of the body, usually the right side.  Nausea, vomiting, or loss of appetite. How is this diagnosed? This condition may be diagnosed based on:  Your symptoms and medical history.  A physical exam.  Tests to confirm the diagnosis. These may include: ? Blood tests to check kidney function and look for signs of infection. ? Urine tests to check for signs of infection, including bacteria, white or red blood cells, and protein. ? Tests to grow and identify the type of bacteria that is causing the infection (urine culture). ? Imaging studies of your kidneys to learn more about your condition. How is this treated? This condition is treated in the hospital with antibiotics that are given into one of your veins through an IV. Your health care provider:  Will start you on an antibiotic that is effective against common urinary tract infections.  May switch to another antibiotic if the results of your urine culture show that your infection is caused by different bacteria.  Will be careful to choose antibiotics that are the  safest during pregnancy. Other treatments may include:  IV fluids if you are nauseous and not able to drink fluids.  Pain and fever medicines.  Medicines for nausea and vomiting. You will be able to go home when your infection is under control. To prevent another infection, you may need to continue taking antibiotics by mouth until your baby is born. Follow these instructions at home: Medicines  Take over-the-counter and prescription medicines only as told by your health care provider.  Take your antibiotic medicine as told by your health care provider. Do not stop taking the antibiotic even if you start to feel better.  Continue to take your prenatal vitamins.   Lifestyle  Follow instructions from your health care provider about eating or drinking restrictions. You may need to avoid: ? Foods and drinks with added sugar. ? Caffeine and fruit juice.  Drink enough fluid to keep your urine pale yellow.  Go to the bathroom frequently. Do not hold your urine. Try to empty your bladder completely.  Change your underwear every day. Wear all-cotton underwear. Do not wear tight underwear or pants.   General instructions  Take these steps to lower the risk of bacteria getting into your urinary tract: ? Use liquid soap instead of bar soap when showering or bathing. Bacteria can grow on bar soap. ? When you wash yourself, clean the urethra opening first. Use a washcloth to clean the area between your vagina and anus. Pat the area dry with a clean towel. ? Wash your hands before  and after you go to the bathroom. ? Wipe yourself from front to back after going to the bathroom. ? Do not use douches, perfumed soap, creams, or powders. ? Do not soak in a bath for more than 30 minutes.  Return to your normal activities as told by your health care provider. Ask your health care provider what activities are safe for you.  Keep all follow-up visits as told by your health care provider. This is  important.   Contact a health care provider if:  You have chills or a fever.  You have any symptoms of infection that do not get better at home.  Symptoms of infection come back.  You have a reaction or side effects from your antibiotic. Get help right away if:  You start having contractions. Summary  Pyelonephritis is an infection of the kidney or kidneys.  This condition results when a bacterial infection in the lower urinary tract spreads to the kidney.  Lower urinary tract infections are common during pregnancy.  Pyelonephritis causes chills, a fever, flank pain, and nausea.  Pyelonephritis is a serious infection that is usually treated in the hospital with IV antibiotics. This information is not intended to replace advice given to you by your health care provider. Make sure you discuss any questions you have with your health care provider. Document Revised: 06/02/2018 Document Reviewed: 05/12/2017 Elsevier Patient Education  2021 [employer].

## 2020-06-18 NOTE — BH Specialist Note (Addendum)
Integrated Behavioral Health via Telemedicine Visit  06/18/2020 [name] [id number]  Less than 5 minutes by phone; pt states she did not consent to visit today, but agrees to receive information on After Visit Summary, and will contact [facility] Mcgu[facility] Gasparyan at [phone] if any needs arise throughout remaining pregnancy through postpartum time, including any further questions or concerns.   Pt requests information about safety of Zoloft in pregnancy, and is encouraged to take prenatal vitamin daily.

## 2020-06-18 NOTE — Discharge Summary (Signed)
Physician Discharge Summary  Patient ID: Stephanie Velazquez MRN: [id number] DOB/AGE: [birth year] 22 y.o.  Admit date: 06/16/2020 Discharge date: 06/18/2020  Admission Diagnoses: Pregnancy at 17/0. Right sided pyelonephritis. History of UTI with negative test of cure  Discharge Diagnoses: Pregnancy at 17/2. Improving pyelonephritis. GBS bacteruria  Discharged Condition: good  Hospital Course: Patient presented with right back pain and low grade fever with nausea; no preterm labor s/s. Workup revealed right pyelo with right CVAT; WBC 14.5; u/a with trace leukocytes, ketones, many bacteria, 21-50 squam cells and fever of 101.2 and negative covid pcr. Patient started on rocephin 2gm qday and did well on this and admit renal u/s only showed minimal right hydro. UCx showed 80k klebsiella and 20k GBS with resistance to amp, sensitive to rocephin and intermediate to macrobid. Patient discharged home on 11 day course of bactrim. ID called and they said keflex should be fine for suppression. Patient told to start keflex 500 qhs for the rest of pregnancy after finishing all the bactrim.  Patient had reassuring fetal heart tones daily during her hospital stay.   Discharge Exam: Blood pressure 114/65, pulse 69, temperature 98 F (36.7 C), temperature source Oral, resp. rate 18, height 5\' 8"  (1.727 m), weight 66.8 kg, SpO2 100 %. General appearance: alert Back: minimal right CVAT. patient rest on her right side Resp: clear to auscultation bilaterally Cardio: regular rate and rhythm, S1, S2 normal, no murmur, click, rub or gallop GI: gravid, nttp Extremities: extremities normal, atraumatic, no cyanosis or edema Skin: Skin color, texture, turgor normal. No rashes or lesions Neurologic: Grossly normal  Disposition: Discharge disposition: 01-Home or Self Care       Discharge Instructions    Discharge patient   Complete by: As directed    After bactrim is given   Discharge disposition: 01-Home or Self  Care   Discharge patient date: 06/18/2020     Allergies as of 06/18/2020      Reactions   Flagyl [metronidazole] Hives   Monistat [miconazole] Hives   Burning inside      Medication List    TAKE these medications   acetaminophen 500 MG tablet Commonly known as: TYLENOL Take 500 mg by mouth every 6 (six) hours as needed for moderate pain or fever.   cephALEXin 500 MG capsule Commonly known as: KEFLEX Take 1 capsule (500 mg total) by mouth at bedtime. Start taking on: Jun 29, 2020   clotrimazole 1 % vaginal cream Commonly known as: GYNE-LOTRIMIN Place 1 Applicatorful vaginally at bedtime.   Doxylamine-Pyridoxine 10-10 MG Tbec Commonly known as: Diclegis Take 2 tablets by mouth at bedtime. If symptoms persist, add one tablet in the morning and one in the afternoon   multivitamin-prenatal 27-0.8 MG Tabs tablet Take 1 tablet by mouth daily at 12 noon.   promethazine 25 MG tablet Commonly known as: PHENERGAN Take 1 tablet (25 mg total) by mouth every 6 (six) hours as needed for nausea or vomiting.   sertraline 25 MG tablet Commonly known as: Zoloft Take 1 tablet (25 mg total) by mouth daily.   sulfamethoxazole-trimethoprim 800-160 MG tablet Commonly known as: BACTRIM DS Take 1 tablet by mouth every 12 (twelve) hours for 11 days.       Follow-up Information    Center for Jul 01, 2020 at [facility]. Go in 15 day(s).   Specialty: Obstetrics and Gynecology Contact information: [address] [place] [phone]  SignedRandolph Bing 06/18/2020, 10:09 AM

## 2020-06-19 ENCOUNTER — Telehealth: Payer: Self-pay | Admitting: *Deleted

## 2020-06-19 ENCOUNTER — Other Ambulatory Visit: Payer: Self-pay | Admitting: Certified Nurse Midwife

## 2020-06-19 DIAGNOSIS — O2342 Unspecified infection of urinary tract in pregnancy, second trimester: Secondary | ICD-10-CM

## 2020-06-19 DIAGNOSIS — B951 Streptococcus, group B, as the cause of diseases classified elsewhere: Secondary | ICD-10-CM

## 2020-06-19 MED ORDER — CEFADROXIL 500 MG PO CAPS: 500.0000 mg | ORAL_CAPSULE | Freq: Two times a day (BID) | ORAL | 0 refills | Status: DC

## 2020-06-19 NOTE — Telephone Encounter (Signed)
Pt called back, was having some pain last night after being discharged  But after taking antibiotic this morning she is feeling better. Advised to increase water intake and if pain becomes worse she can call us back or go to [facility] if its the weekend. Pt also states she has had a HA for the past few days. Informed pt she can try an allergy pill or nose spray to see if it will help as it could be allergy related. Pt verbalizes and understands

## 2020-06-19 NOTE — Progress Notes (Signed)
GBS UTI

## 2020-06-19 NOTE — Telephone Encounter (Signed)
Left message for pt to call back  °

## 2020-06-30 ENCOUNTER — Other Ambulatory Visit: Payer: Medicaid Other

## 2020-07-02 ENCOUNTER — Ambulatory Visit: Payer: Medicaid Other | Admitting: Clinical

## 2020-07-02 DIAGNOSIS — Z3402 Encounter for supervision of normal first pregnancy, second trimester: Secondary | ICD-10-CM

## 2020-07-02 NOTE — Patient Instructions (Addendum)
Center for [facility] [place], [place] [address] [phone] (main office) [phone] [facility] office)   Www.[email or web address].org (information about medications and other exposures during pregnancy and breastfeeding)  Www.[email or web address].com (virtual tour of hospital, register for childbirth education classes, etc.)  New mom support for postpartum time:  Www.postpartum.net  Www.conehealthybaby.com     BRAINSTORMING  Develop a Plan Goals: . Provide a way to start conversation about your new life with a baby . Assist parents in recognizing and using resources within their reach . Help pave the way before birth for an easier period of transition afterwards.  Make a list of the following information to keep in a central location: . Full name of Mom and Partner: _____________________________________________ . 54 full name and Date of Birth: ___________________________________________ . Home Address: ___________________________________________________________ ________________________________________________________________________ . Home Phone: ____________________________________________________________ . Parents' cell numbers: _____________________________________________________ ________________________________________________________________________ . Name and contact info for OB: ______________________________________________ . Name and contact info for Pediatrician:________________________________________ . Contact info for Lactation Consultants: ________________________________________  REST and SLEEP *You each need at least 4-5 hours of uninterrupted sleep every day. Write specific names and contact information.* . How are you going to rest in the postpartum period? While partner's home? When partner returns to work? When you both return to work? Marland Kitchen Where will your baby sleep? Marland Kitchen Who is available to help during the day?  Evening? Night? . Who could move in for a period to help support you? Marland Kitchen What are some ideas to help you get enough sleep? __________________________________________________________________________________________________________________________________________________________________________________________________________________________________________ NUTRITIOUS FOOD AND DRINK *Plan for meals before your baby is born so you can have healthy food to eat during the immediate postpartum period.* . Who will look after breakfast? Lunch? Dinner? List names and contact information. Brainstorm quick, healthy ideas for each meal. . What can you do before baby is born to prepare meals for the postpartum period? . How can others help you with meals? Marland Kitchen Which grocery stores provide online shopping and delivery? Marland Kitchen Which restaurants offer take-out or delivery options? ______________________________________________________________________________________________________________________________________________________________________________________________________________________________________________________________________________________________________________________________________________________________________________________________________  CARE FOR MOM *It's important that mom is cared for and pampered in the postpartum period. Remember, the most important ways new mothers need care are: sleep, nutrition, gentle exercise, and time off.* . Who can come take care of mom during this period? Make a list of people with their contact information. . List some activities that make you feel cared for, rested, and energized? Who can make sure you have opportunities to do these things? . Does mom have a space of her very own within your home that's just for her? Make a "[facility]" where she can be comfortable, rest, and renew herself  daily. ______________________________________________________________________________________________________________________________________________________________________________________________________________________________________________________________________________________________________________________________________________________________________________________________________    CARE FOR AND FEEDING BABY *Knowledgeable and encouraging people will offer the best support with regard to feeding your baby.* . Educate yourself and choose the best feeding option for your baby. . Make a list of people who will guide, support, and be a resource for you as your care for and feed your baby. (Friends that have breastfed or are currently breastfeeding, lactation consultants, breastfeeding support groups, etc.) . Consider a postpartum doula. (These websites can give you information: dona.org & BuyingShow.es) . Seek out local breastfeeding resources like the breastfeeding support group at [employer] or [employer]. ______________________________________________________________________________________________________________________________________________________________________________________________________________________________________________________________________________________________________________________________________________________________________________________________________  Verner Chol AND ERRANDS . Who can help with a thorough cleaning before baby is born? . Make a list of people who will help with housekeeping and chores, like laundry, light cleaning,  dishes, bathrooms, etc. . Who can run some errands for you? Marland Kitchen What can you do to make sure you are stocked with basic supplies before baby is born? . Who is going to do the  shopping? ______________________________________________________________________________________________________________________________________________________________________________________________________________________________________________________________________________________________________________________________________________________________________________________________________     Family Adjustment *Nurture yourselves.it helps parents be more loving and allows for better bonding with their child.* . What sorts of things do you and partner enjoy doing together? Which activities help you to connect and strengthen your relationship? Make a list of those things. Make a list of people whom you trust to care for your baby so you can have some time together as a couple. . What types of things help partner feel connected to Mom? Make a list. . What needs will partner have in order to bond with baby? . Other children? Who will care for them when you go into labor and while you are in the hospital? . Think about what the needs of your older children might be. Who can help you meet those needs? In what ways are you helping them prepare for bringing baby home? List some specific strategies you have for family adjustment. _______________________________________________________________________________________________________________________________________________________________________________________________________________________________________________________________________________________________________________________________________________  SUPPORT *Someone who can empathize with experiences normalizes your problems and makes them more bearable.* . Make a list of other friends, neighbors, and/or co-workers you know with infants (and small children, if applicable) with whom you can connect. . Make a list of local or online support groups, mom groups, etc. in which you can be  involved. ______________________________________________________________________________________________________________________________________________________________________________________________________________________________________________________________________________________________________________________________________________________________________________________________________  Childcare Plans . Investigate and plan for childcare if mom is returning to work. . Talk about mom's concerns about her transition back to work. . Talk about partner's concerns regarding this transition.  Mental Health *Your mental health is one of the highest priorities for a pregnant or postpartum mom.* . 1 in 5 women experience anxiety and/or depression from the time of conception through the first year after birth. . Postpartum Mood Disorders are the #1 complication of pregnancy and childbirth and the suffering experienced by these mothers is not necessary! These illnesses are temporary and respond well to treatment, which often includes self-care, social support, talk therapy, and medication when needed. . Women experiencing anxiety and depression often say things like: "I'm supposed to be happy.why do I feel so sad?", "Why can't I snap out of it?", "I'm having thoughts that scare me." . There is no need to be embarrassed if you are feeling these symptoms: o Overwhelmed, anxious, angry, sad, guilty, irritable, hopeless, exhausted but can't sleep o You are NOT alone. You are NOT to blame. With help, you WILL be well. . Where can I find help? Medical professionals such as your OB, midwife, gynecologist, family practitioner, primary care provider, pediatrician, or mental health providers; [facility] support groups: Feelings After Birth, Breastfeeding Support Group, Baby and Me Group, and Fit 4 Two exercise classes. . You have permission to ask for help. It will confirm your feelings, validate your  experiences, share/learn coping strategies, and gain support and encouragement as you heal. You are important! BRAINSTORM . Make a list of local resources, including resources for mom and for partner. . Identify support groups. . Identify people to call late at night - include names and contact info. . Talk with partner about perinatal mood and anxiety disorders. . Talk with your OB, midwife, and doula about baby blues and about perinatal mood and anxiety disorders. . Talk with your pediatrician about perinatal mood and anxiety disorders.   Support &  Sanity Savers   What do you really need?  . Basics . In preparing for a new baby, many expectant parents spend hours shopping for baby clothes, decorating the nursery, and deciding which car seat to buy. Yet most don't think much about what the reality of parenting a newborn will be like, and what they need to make it through that. So, here is the advice of experienced parents. We know you'll read this, and think "they're exaggerating, I don't really need that." Just trust [place] on these, OK? Plan for all of this, and if it turns out you don't need it, come back and teach [place] how you did it!  Satira Anis (Once baby's survival needs are met, make sure you attend to your own survival needs!) . Sleep . An average newborn sleeps 16-18 hours per day, over 6-7 sleep periods, rarely more than three hours at a time. It is normal and healthy for a newborn to wake throughout the night... but really hard on parents!! . Naps. Prioritize sleep above any responsibilities like: cleaning house, visiting friends, running errands, etc.  Sleep whenever baby sleeps. If you can't nap, at least have restful times when baby eats. The more rest you get, the more patient you will be, the more emotionally stable, and better at solving problems.  . Food . You may not have realized it would be difficult to eat when you have a newborn. Yet, when we talk to . countless new  parents, they say things like "it may be 2:00 pm when I realize I haven't had breakfast yet." Or "every time we sit down to dinner, baby needs to eat, and my food gets cold, so I don't bother to eat it." . Finger food. Before your baby is born, stock up with one months' worth of food that: 1) you can eat with one hand while holding a baby, 2) doesn't need to be prepped, 3) is good hot or cold, 4) doesn't spoil when left out for a few hours, and 5) you like to eat. Think about: nuts, dried fruit, Clif bars, pretzels, jerky, gogurt, baby carrots, apples, bananas, crackers, cheez-n-crackers, string cheese, hot pockets or frozen burritos to microwave, garden burgers and breakfast pastries to put in the toaster, yogurt drinks, etc. . Restaurant Menus. Make lists of your favorite restaurants & menu items. When family/friends want to help, you can give specific information without much thought. They can either bring you the food or send gift cards for just the right meals. Stephanie Velazquez Meals.  Take some time to make a few meals to put in the freezer ahead of time.  Easy to freeze meals can be anything such as soup, lasagna, chicken pie, or spaghetti sauce. . Set up a Meal Schedule.  Ask friends and family to sign up to bring you meals during the first few weeks of being home. (It can be passed around at baby showers!) You have no idea how helpful this will be until you are in the throes of parenting.  https://hamilton-woodard.com/ is a great website to check out. . Emotional Support . Know who to call when you're stressed out. Parenting a newborn is very challenging work. There are times when it totally overwhelms your normal coping abilities. EVERY NEW PARENT NEEDS TO HAVE A PLAN FOR WHO TO CALL WHEN THEY JUST CAN'T COPE ANY MORE. (And it has to be someone other than the baby's other parent!) Before your baby is born, come up with at least one  person you can call for support - write their phone number down and post it on the  refrigerator. Marland Kitchen Anxiety & Sadness. Baby blues are normal after pregnancy; however, there are more severe types of anxiety & sadness which can occur and should not be ignored.  They are always treatable, but you have to take the first step by reaching out for help. [facility] offers a "Mom Talk" group which meets every Tuesday from 10 am - 11 am.  This group is for new moms who need support and connection after their babies are born.  Call [phone].  Marland Kitchen Really, Really Helpful (Plan for them! Make sure these happen often!!) . Physical Support with Taking Care of Yourselves . Asking friends and family. Before your baby is born, set up a schedule of people who can come and visit and help out (or ask a friend to schedule for you). Any time someone says "let me know what I can do to help," sign them up for a day. When they get there, their job is not to take care of the baby (that's your job and your joy). Their job is to take care of you!  . Postpartum doulas. If you don't have anyone you can call on for support, look into postpartum doulas:  professionals at helping parents with caring for baby, caring for themselves, getting breastfeeding started, and helping with household tasks. www.padanc.org is a helpful website for learning about doulas in our area. . Peer Support / Parent Groups . Why: One of the greatest ideas for new parents is to be around other new parents. Parent groups give you a chance to share and listen to others who are going through the same season of life, get a sense of what is normal infant development by watching several babies learn and grow, share your stories of triumph and struggles with empathetic ears, and forgive your own mistakes when you realize all parents are learning by trial and error. . Where to find: There are many places you can meet other new parents throughout our community.  [facility] offers the following classes for new moms and their little ones:  Baby  and Me (Birth to [place]) and Breastfeeding Support Group. Go to www.conehealthybaby.com or call [phone] for more information. . Time for your Relationship . It's easy to get so caught up in meeting baby's immediate needs that it's hard to find time to connect with your partner, and meet the needs of your relationship. It's also easy to forget what "quality time with your partner" actually looks like. If you take your baby on a date, you'd be amazed how much of your couple time is spent feeding the baby, diapering the baby, admiring the baby, and talking about the baby. . Dating: Try to take time for just the two of you. Babysitter tip: Sometimes when moms are breastfeeding a newborn, they find it hard to figure out how to schedule outings around baby's unpredictable feeding schedules. Have the babysitter come for a three hour period. When she comes over, if baby has just eaten, you can leave right away, and come back in two hours. If baby hasn't fed recently, you start the date at home. Once baby gets hungry and gets a good feeding in, you can head out for the rest of your date time. . Date Nights at Home: If you can't get out, at least set aside one evening a week to prioritize your relationship: whenever baby dozes off or doesn't  have any immediate needs, spend a little time focusing on each other. . Potential conflicts: The main relationship conflicts that come up for new parents are: issues related to sexuality, financial stresses, a feeling of an unfair division of household tasks, and conflicts in parenting styles. The more you can work on these issues before baby arrives, the better!  Clint Guy and Frills (Don't forget these. and don't feel guilty for indulging in them!) . Everyone has something in life that is a fun little treat that they do just for themselves. It may be: reading the morning paper, or going for a daily jog, or having coffee with a friend once a week, or going to a movie on Friday  nights, or fine chocolates, or bubble baths, or curling up with a good book. . Unless you do fun things for yourself every now and then, it's hard to have the energy for fun with your baby. Whatever your "special" treats are, make sure you find a way to continue to indulge in them after your baby is born. These special moments can recharge you, and allow you to return to baby with a new joy   PERINATAL MOOD DISORDERS: [place]   Emergency and Crisis Resources:  If you are an imminent risk to self or others, are experiencing intense personal distress, and/or have noticed significant changes in activities of daily living, call:  . 911 . [facility]: [phone] . Mobile Crisis: 6368881650 . National Suicide Hotline: 985-012-0897 Or visit the following crisis centers: . Local Emergency Departments . [facility]: [address], [place]. Hours: 8:30AM-5PM. Insurance Accepted: Medicaid, Medicare, and Uninsured.  Marland Kitchen [facility]  [address] [place] Mon-Friday 8am-3pm  [phone]                                                                                    Non-Crisis Resources: To identify specific providers that are covered by your insurance, contact your insurance company or local agencies: [place]: [phone] [facility]: [phone] [place]: [phone] Postpartum Support International- [place] 1-226-117-7046                                                      Outpatient therapy and medication management providers:  [facility] [phone] Hours: 9AM-5PM  Insurance Accepted: Alben Spittle, Lorella Nimrod, Freddrick March, [place], Medicare  [facility]) [phone] Hours: 8AM-5PM  nsurance Accepted: All insurances EXCEPT AARP, [place], [place], and  [place]: [phone]             Hours: 8AM-8PM Insurance Accepted: Cristal Ford, Freddrick March, [place], Medicare, [phone] [facility]: [phone] Hours: 8:30AM-7PM Insurance Accepted: Cristal Ford, Medicaid, Medicare, [facility]:  [phone]              [place] Accepted:  Airline pilot, [place], [employer],  Medicaid, Luis Lopez [phone] Hours: 9AM-5:30PM Insurance Accepted: Alben Spittle, Charlotte Crumb, and [place], Medicare, [facility]:  [phone] Hours: 9am-5pm Insurance Accepted: BCBS; they do not accept Medicaid/Medicare [facility] [place]: [phone] Hours: 9am-9pm Insurance Accepted: All major insurance including Medicaid and Medicare [facility]: [phone] Hours: 9AM-5:30PM Insurance Accepted: All insurances EXCEPT Medicaid and Medicare. [facility]: [place]: [phone] [place]:  [facility] [place] (support for children in the NICU and/or with special needs), [place] [employer]: [phone]                                                                                     Online Resources: Postpartum Support International: http://jones-berg.com/  800-944-4PPD 2Moms Supporting Moms:  www.momssupportingmoms.net

## 2020-07-03 ENCOUNTER — Other Ambulatory Visit: Payer: Self-pay

## 2020-07-03 ENCOUNTER — Other Ambulatory Visit (HOSPITAL_COMMUNITY)
Admission: RE | Admit: 2020-07-03 | Discharge: 2020-07-03 | Disposition: A | Discharge status: Home / Self Care | Payer: Medicaid Other | Source: Ambulatory Visit | Attending: Family Medicine | Admitting: Family Medicine

## 2020-07-03 ENCOUNTER — Ambulatory Visit (INDEPENDENT_AMBULATORY_CARE_PROVIDER_SITE_OTHER): Payer: Medicaid Other | Admitting: Family Medicine

## 2020-07-03 VITALS — BP 126/81 | HR 72 | Wt 152.0 lb

## 2020-07-03 DIAGNOSIS — N898 Other specified noninflammatory disorders of vagina: Secondary | ICD-10-CM

## 2020-07-03 DIAGNOSIS — Z3401 Encounter for supervision of normal first pregnancy, first trimester: Secondary | ICD-10-CM

## 2020-07-03 NOTE — Progress Notes (Signed)
   PRENATAL VISIT NOTE  Subjective:  Stephanie Velazquez is a 20 y.o. G1P0000 at [redacted]w[redacted]d being seen today for ongoing prenatal care.  She is currently monitored for the following issues for this low-risk pregnancy and has Encounter for supervision of normal first pregnancy in first trimester; UTI in pregnancy; Pyelonephritis affecting pregnancy; and GBS bacteriuria on their problem list.  Patient reports vaginal irritation and white discharge.  Contractions: Not present. Vag. Bleeding: None.   . Denies leaking of fluid.   The following portions of the patient's history were reviewed and updated as appropriate: allergies, current medications, past family history, past medical history, past social history, past surgical history and problem list.   Objective:   Vitals:   07/03/20 1612  BP: 126/81  Pulse: 72  Weight: 152 lb (68.9 kg)    Fetal Status: Fetal Heart Rate (bpm): 138         General:  Alert, oriented and cooperative. Patient is in no acute distress.  Skin: Skin is warm and dry. No rash noted.   Cardiovascular: Normal heart rate noted  Respiratory: Normal respiratory effort, no problems with respiration noted  Abdomen: Soft, gravid, appropriate for gestational age.  Pain/Pressure: Present     Pelvic: Cervical exam deferred        Extremities: Normal range of motion.     Mental Status: Normal mood and affect. Normal behavior. Normal judgment and thought content.   Assessment and Plan:  Pregnancy: G1P0000 at [redacted]w[redacted]d 1. Encounter for supervision of normal first pregnancy in second trimester Has anatomy u/s scheduled 5/20 - AFP, Serum, Open Spina Bifida  2. Vaginal discharge Check wet prep and treat--may use OTC meds as needed - Cervicovaginal ancillary only  3. H/o Pyelonephritis Continue keflex for suppression  General obstetric precautions including but not limited to vaginal bleeding, contractions, leaking of fluid and fetal movement were reviewed in detail with the  patient. Please refer to After Visit Summary for other counseling recommendations.   Return in 4 weeks (on 07/31/2020).  Future Appointments  Date Time Provider Department Center  07/08/2020  2:15 PM [facility] US2 [facility]  07/31/2020  4:15 PM Oradell Bing, MD [facility]  08/28/2020  8:15 AM Anyanwu, Jethro Bastos, MD [facility]  09/18/2020  4:00 PM [place] Bing, MD [facility]    Reva Bores, MD

## 2020-07-03 NOTE — Patient Instructions (Signed)

## 2020-07-04 LAB — AFP, SERUM, OPEN SPINA BIFIDA
AFP MoM: 1
AFP Value: 51.6 ng/mL
Gest. Age on Collection Date: 19 weeks
Maternal Age At EDD: 21.9 yr
OSBR Risk 1 IN: 10000
Test Results:: NEGATIVE
Weight: 152 [lb_av]

## 2020-07-07 LAB — CERVICOVAGINAL ANCILLARY ONLY
Bacterial Vaginitis (gardnerella): NEGATIVE
Candida Glabrata: NEGATIVE
Candida Vaginitis: POSITIVE — AB
Comment: NEGATIVE
Comment: NEGATIVE
Comment: NEGATIVE

## 2020-07-07 MED ORDER — TERCONAZOLE 0.8 % VA CREA: 1.0000 | TOPICAL_CREAM | Freq: Every day | VAGINAL | 0 refills | Status: DC

## 2020-07-07 NOTE — Addendum Note (Signed)
Addended by: Reva Bores on: 07/07/2020 11:41 AM   Modules accepted: Orders

## 2020-07-08 ENCOUNTER — Other Ambulatory Visit: Payer: Self-pay | Admitting: Advanced Practice Midwife

## 2020-07-08 ENCOUNTER — Other Ambulatory Visit: Payer: Self-pay

## 2020-07-08 ENCOUNTER — Ambulatory Visit: Payer: Medicaid Other | Attending: Advanced Practice Midwife

## 2020-07-08 DIAGNOSIS — Z3401 Encounter for supervision of normal first pregnancy, first trimester: Secondary | ICD-10-CM | POA: Diagnosis not present

## 2020-07-09 ENCOUNTER — Other Ambulatory Visit: Payer: Self-pay | Admitting: *Deleted

## 2020-07-09 DIAGNOSIS — Z362 Encounter for other antenatal screening follow-up: Secondary | ICD-10-CM

## 2020-07-10 ENCOUNTER — Encounter: Payer: Self-pay | Admitting: *Deleted

## 2020-07-31 ENCOUNTER — Ambulatory Visit (INDEPENDENT_AMBULATORY_CARE_PROVIDER_SITE_OTHER): Payer: Medicaid Other | Admitting: Obstetrics and Gynecology

## 2020-07-31 ENCOUNTER — Other Ambulatory Visit: Payer: Self-pay

## 2020-07-31 VITALS — BP 126/78 | HR 92 | Wt 161.0 lb

## 2020-07-31 DIAGNOSIS — Z3A23 23 weeks gestation of pregnancy: Secondary | ICD-10-CM

## 2020-07-31 DIAGNOSIS — R8271 Bacteriuria: Secondary | ICD-10-CM

## 2020-07-31 DIAGNOSIS — O2302 Infections of kidney in pregnancy, second trimester: Secondary | ICD-10-CM

## 2020-07-31 DIAGNOSIS — Z3401 Encounter for supervision of normal first pregnancy, first trimester: Secondary | ICD-10-CM

## 2020-07-31 NOTE — Progress Notes (Signed)
   PRENATAL VISIT NOTE  Subjective:  Stephanie Velazquez is a 20 y.o. G1P0000 at [redacted]w[redacted]d being seen today for ongoing prenatal care.  She is currently monitored for the following issues for this low-risk pregnancy and has Encounter for supervision of normal first pregnancy in first trimester; UTI in pregnancy; Pyelonephritis affecting pregnancy; and GBS bacteriuria on their problem list.  Patient reports no complaints.  Contractions: Not present. Vag. Bleeding: None.  Movement: Present. Denies leaking of fluid.   The following portions of the patient's history were reviewed and updated as appropriate: allergies, current medications, past family history, past medical history, past social history, past surgical history and problem list.   Objective:   Vitals:   07/31/20 1617  BP: 126/78  Pulse: 92  Weight: 161 lb (73 kg)    Fetal Status: Fetal Heart Rate (bpm): 137   Movement: Present     General:  Alert, oriented and cooperative. Patient is in no acute distress.  Skin: Skin is warm and dry. No rash noted.   Cardiovascular: Normal heart rate noted  Respiratory: Normal respiratory effort, no problems with respiration noted  Abdomen: Soft, gravid, appropriate for gestational age.  Pain/Pressure: Present     Pelvic: Cervical exam deferred        Extremities: Normal range of motion.     Mental Status: Normal mood and affect. Normal behavior. Normal judgment and thought content.   Assessment and Plan:  Pregnancy: G1P0000 at [redacted]w[redacted]d 1. GBS bacteriuria Tx in labor  2. Pyelonephritis affecting pregnancy in second trimester Pt recently stopped the qhs keflex b/c of GI side effects; she was intermediate for macrobid on her ucx. I told her I'd do some research to see if there's anything aside from the keflex that we can use for suppression. I told her she probably has a 10% max chance of another UTI and to stay well hydrated, let us know about any potential early s/s and recommend qmonth UCx for  surveillance  3. Encounter for supervision of normal first pregnancy in first trimester 28wk labs nv Has completion anatomy u/s next week  Preterm labor symptoms and general obstetric precautions including but not limited to vaginal bleeding, contractions, leaking of fluid and fetal movement were reviewed in detail with the patient. Please refer to After Visit Summary for other counseling recommendations.   Return in about 4 weeks (around 08/28/2020) for in person, low risk ob, md or app, fasting 2hr GTT.  Future Appointments  Date Time Provider Department Center  08/05/2020  3:15 PM [facility] NURSE [facility]  08/05/2020  3:30 PM [facility] US3 [facility]  08/28/2020  8:15 AM Anyanwu, Jethro Bastos, MD [facility]  09/18/2020  4:00 PM [place] Bing, MD [facility]    [place] Bing, MD

## 2020-08-05 ENCOUNTER — Ambulatory Visit: Payer: Medicaid Other | Admitting: *Deleted

## 2020-08-05 ENCOUNTER — Other Ambulatory Visit: Payer: Self-pay

## 2020-08-05 ENCOUNTER — Other Ambulatory Visit: Payer: Self-pay | Admitting: *Deleted

## 2020-08-05 ENCOUNTER — Encounter: Payer: Self-pay | Admitting: *Deleted

## 2020-08-05 ENCOUNTER — Ambulatory Visit: Payer: Medicaid Other | Attending: Obstetrics and Gynecology

## 2020-08-05 VITALS — BP 132/67 | HR 78

## 2020-08-05 DIAGNOSIS — O43192 Other malformation of placenta, second trimester: Secondary | ICD-10-CM | POA: Diagnosis not present

## 2020-08-05 DIAGNOSIS — O43199 Other malformation of placenta, unspecified trimester: Secondary | ICD-10-CM

## 2020-08-05 DIAGNOSIS — Z3401 Encounter for supervision of normal first pregnancy, first trimester: Secondary | ICD-10-CM

## 2020-08-05 DIAGNOSIS — Z362 Encounter for other antenatal screening follow-up: Secondary | ICD-10-CM | POA: Diagnosis not present

## 2020-08-05 DIAGNOSIS — O321XX0 Maternal care for breech presentation, not applicable or unspecified: Secondary | ICD-10-CM | POA: Diagnosis not present

## 2020-08-05 DIAGNOSIS — Z3A24 24 weeks gestation of pregnancy: Secondary | ICD-10-CM

## 2020-08-12 ENCOUNTER — Encounter: Payer: Medicaid Other | Admitting: Obstetrics and Gynecology

## 2020-08-12 NOTE — Telephone Encounter (Signed)
Called patient to have her come in to leave a urine sample. Pt denies any vaginal bleeding, abnormal discharge and baby is moving. Pt states she is having a constant cramp and sharp pain in her lower abdomen. Pt has not taken any tylenol, advised pt to take extra strength tylenol as directed and we will send urine sample.

## 2020-08-28 ENCOUNTER — Other Ambulatory Visit: Payer: Self-pay

## 2020-08-28 ENCOUNTER — Ambulatory Visit (INDEPENDENT_AMBULATORY_CARE_PROVIDER_SITE_OTHER): Payer: Medicaid Other | Admitting: Obstetrics & Gynecology

## 2020-08-28 ENCOUNTER — Encounter: Payer: Self-pay | Admitting: Obstetrics & Gynecology

## 2020-08-28 VITALS — BP 121/76 | HR 80 | Wt 167.4 lb

## 2020-08-28 DIAGNOSIS — Z3402 Encounter for supervision of normal first pregnancy, second trimester: Secondary | ICD-10-CM

## 2020-08-28 DIAGNOSIS — R8271 Bacteriuria: Secondary | ICD-10-CM

## 2020-08-28 DIAGNOSIS — Z3A27 27 weeks gestation of pregnancy: Secondary | ICD-10-CM

## 2020-08-28 DIAGNOSIS — O43199 Other malformation of placenta, unspecified trimester: Secondary | ICD-10-CM | POA: Insufficient documentation

## 2020-08-28 DIAGNOSIS — O2300 Infections of kidney in pregnancy, unspecified trimester: Secondary | ICD-10-CM

## 2020-08-28 NOTE — Progress Notes (Signed)
   PRENATAL VISIT NOTE  Subjective:  Stephanie Velazquez is a 20 y.o. G1P0000 at [redacted]w[redacted]d being seen today for ongoing prenatal care.  She is currently monitored for the following issues for this low-risk pregnancy and has Encounter for supervision of normal first pregnancy; UTI in pregnancy; Pyelonephritis affecting pregnancy; GBS bacteriuria; and Marginal insertion of umbilical cord affecting management of mother on their problem list.  Patient reports no complaints.  Contractions: Not present. Vag. Bleeding: None.  Movement: Present. Denies leaking of fluid.   The following portions of the patient's history were reviewed and updated as appropriate: allergies, current medications, past family history, past medical history, past social history, past surgical history and problem list.   Objective:   Vitals:   08/28/20 0823  BP: 121/76  Pulse: 80  Weight: 167 lb 6.4 oz (75.9 kg)    Fetal Status: Fetal Heart Rate (bpm): 146 Fundal Height: 27 cm Movement: Present     General:  Alert, oriented and cooperative. Patient is in no acute distress.  Skin: Skin is warm and Velazquez. No rash noted.   Cardiovascular: Normal heart rate noted  Respiratory: Normal respiratory effort, no problems with respiration noted  Abdomen: Soft, gravid, appropriate for gestational age.  Pain/Pressure: Present     Pelvic: Cervical exam deferred        Extremities: Normal range of motion.     Mental Status: Normal mood and affect. Normal behavior. Normal judgment and thought content.   Assessment and Plan:  Pregnancy: G1P0000 at [redacted]w[redacted]d 1. Marginal insertion of umbilical cord affecting management of mother Follow up MFM scan.  2. Pyelonephritis affecting pregnancy, antepartum Surveillance culture done today.  - Culture, OB Urine  3. [redacted] weeks gestation of pregnancy 4. Encounter for supervision of normal first pregnancy in second trimester Labs done today, will follow up results and manage accordingly. - Glucose  Tolerance, 2 Hours w/1 Hour - CBC - RPR - HIV Antibody (routine testing w rflx) Preterm labor symptoms and general obstetric precautions including but not limited to vaginal bleeding, contractions, leaking of fluid and fetal movement were reviewed in detail with the patient. Please refer to After Visit Summary for other counseling recommendations.   Return in about 3 weeks (around 09/18/2020) for OFFICE OB VISIT (MD only).  Future Appointments  Date Time Provider Department Center  09/02/2020  3:45 PM [facility] NURSE [facility]  09/02/2020  4:00 PM [facility] US1 [facility]  09/18/2020  4:00 PM [place] Bing, MD [facility]    Jaynie Collins, MD

## 2020-08-28 NOTE — Patient Instructions (Signed)

## 2020-08-28 NOTE — Progress Notes (Signed)
28 wks ROB GTT today Declines TDAP RH + Depression and Anxiety screen negative today

## 2020-08-29 LAB — CBC
Hematocrit: 31.5 % — ABNORMAL LOW (ref 34.0–46.6)
Hemoglobin: 10.4 g/dL — ABNORMAL LOW (ref 11.1–15.9)
MCH: 27.4 pg (ref 26.6–33.0)
MCHC: 33 g/dL (ref 31.5–35.7)
MCV: 83 fL (ref 79–97)
Platelets: 262 10*3/uL (ref 150–450)
RBC: 3.79 x10E6/uL (ref 3.77–5.28)
RDW: 12.9 % (ref 11.7–15.4)
WBC: 13.3 10*3/uL — ABNORMAL HIGH (ref 3.4–10.8)

## 2020-08-29 LAB — HIV ANTIBODY (ROUTINE TESTING W REFLEX): HIV Screen 4th Generation wRfx: NONREACTIVE

## 2020-08-29 LAB — GLUCOSE TOLERANCE, 2 HOURS W/ 1HR
Glucose, 1 hour: 158 mg/dL (ref 65–179)
Glucose, 2 hour: 96 mg/dL (ref 65–152)
Glucose, Fasting: 81 mg/dL (ref 65–91)

## 2020-08-29 LAB — RPR: RPR Ser Ql: NONREACTIVE

## 2020-09-01 LAB — CULTURE, OB URINE

## 2020-09-01 LAB — URINE CULTURE, OB REFLEX

## 2020-09-02 ENCOUNTER — Encounter: Payer: Self-pay | Admitting: *Deleted

## 2020-09-02 ENCOUNTER — Other Ambulatory Visit: Payer: Self-pay

## 2020-09-02 ENCOUNTER — Ambulatory Visit: Payer: Medicaid Other | Admitting: *Deleted

## 2020-09-02 ENCOUNTER — Ambulatory Visit: Payer: Medicaid Other | Attending: Obstetrics and Gynecology

## 2020-09-02 VITALS — BP 136/85 | HR 83

## 2020-09-02 DIAGNOSIS — O43199 Other malformation of placenta, unspecified trimester: Secondary | ICD-10-CM | POA: Diagnosis not present

## 2020-09-02 DIAGNOSIS — Z3403 Encounter for supervision of normal first pregnancy, third trimester: Secondary | ICD-10-CM

## 2020-09-02 DIAGNOSIS — Z362 Encounter for other antenatal screening follow-up: Secondary | ICD-10-CM

## 2020-09-02 DIAGNOSIS — O43193 Other malformation of placenta, third trimester: Secondary | ICD-10-CM | POA: Diagnosis not present

## 2020-09-02 DIAGNOSIS — Z3A28 28 weeks gestation of pregnancy: Secondary | ICD-10-CM

## 2020-09-02 MED ORDER — CEFADROXIL 500 MG PO CAPS: 500.0000 mg | ORAL_CAPSULE | Freq: Two times a day (BID) | ORAL | 0 refills | Status: DC

## 2020-09-02 NOTE — Addendum Note (Signed)
Addended by: Jaynie Collins A on: 09/02/2020 08:14 AM   Modules accepted: Orders

## 2020-09-03 ENCOUNTER — Other Ambulatory Visit: Payer: Self-pay | Admitting: *Deleted

## 2020-09-03 DIAGNOSIS — O43199 Other malformation of placenta, unspecified trimester: Secondary | ICD-10-CM

## 2020-09-09 ENCOUNTER — Telehealth: Payer: Self-pay | Admitting: *Deleted

## 2020-09-09 ENCOUNTER — Ambulatory Visit (INDEPENDENT_AMBULATORY_CARE_PROVIDER_SITE_OTHER): Payer: Medicaid Other | Admitting: *Deleted

## 2020-09-09 ENCOUNTER — Other Ambulatory Visit: Payer: Self-pay

## 2020-09-09 VITALS — BP 114/67 | HR 74

## 2020-09-09 DIAGNOSIS — Z3403 Encounter for supervision of normal first pregnancy, third trimester: Secondary | ICD-10-CM | POA: Diagnosis not present

## 2020-09-09 DIAGNOSIS — O368131 Decreased fetal movements, third trimester, fetus 1: Secondary | ICD-10-CM | POA: Diagnosis not present

## 2020-09-09 NOTE — Telephone Encounter (Signed)
Called pt, pt states she has only felt the baby move 3 times today even after going home and drinking cold and resting. Will have pt come to the office to do NST.

## 2020-09-09 NOTE — Progress Notes (Signed)
Pt here for NST for decreased fetal movement.  Called Dr Adrian Blackwater at [facility], he reviewed tracing. Okay to send pt home.

## 2020-09-15 ENCOUNTER — Other Ambulatory Visit: Payer: Self-pay | Admitting: *Deleted

## 2020-09-15 ENCOUNTER — Telehealth: Payer: Self-pay

## 2020-09-15 MED ORDER — FLUCONAZOLE 150 MG PO TABS: 150.0000 mg | ORAL_TABLET | Freq: Once | ORAL | 0 refills | Status: AC

## 2020-09-15 NOTE — Telephone Encounter (Signed)
Pt called stating she is unhappy with [facility]. Pt states she was prescribed Diflucan and originally she was told she could not take that in pregnancy. I informed pt that Dr. Macon Large would not prescribe a medication that was unsafe or would harm her baby.  Pt originally requesting to transfer to [facility], informed pt we are sister offices with the same protocols. Pt decided she would stick with [facility].  Pt states she is irritated she was referred to behavioral health at one of her past appts with Clayton Bibles, CNM. Explained to pt that is protocol based on her PHQ-9 and GAD-7 score.    At this time, pt verbalizes understanding and has no further questions.

## 2020-09-16 ENCOUNTER — Telehealth: Payer: Self-pay

## 2020-09-16 ENCOUNTER — Encounter (HOSPITAL_COMMUNITY): Payer: Self-pay | Admitting: Obstetrics and Gynecology

## 2020-09-16 ENCOUNTER — Other Ambulatory Visit: Payer: Self-pay

## 2020-09-16 ENCOUNTER — Inpatient Hospital Stay (HOSPITAL_COMMUNITY)
Admission: AD | Admit: 2020-09-16 | Discharge: 2020-09-16 | Disposition: A | Discharge status: Home / Self Care | Payer: Medicaid Other | Attending: Obstetrics and Gynecology | Admitting: Obstetrics and Gynecology

## 2020-09-16 DIAGNOSIS — Z3A30 30 weeks gestation of pregnancy: Secondary | ICD-10-CM

## 2020-09-16 DIAGNOSIS — Z3400 Encounter for supervision of normal first pregnancy, unspecified trimester: Secondary | ICD-10-CM

## 2020-09-16 DIAGNOSIS — Z6790 Unspecified blood type, Rh positive: Secondary | ICD-10-CM

## 2020-09-16 DIAGNOSIS — Z6730 Type AB blood, Rh positive: Secondary | ICD-10-CM | POA: Diagnosis not present

## 2020-09-16 DIAGNOSIS — Z883 Allergy status to other anti-infective agents status: Secondary | ICD-10-CM | POA: Insufficient documentation

## 2020-09-16 DIAGNOSIS — Z3689 Encounter for other specified antenatal screening: Secondary | ICD-10-CM | POA: Diagnosis not present

## 2020-09-16 DIAGNOSIS — Z881 Allergy status to other antibiotic agents status: Secondary | ICD-10-CM | POA: Insufficient documentation

## 2020-09-16 DIAGNOSIS — O4690 Antepartum hemorrhage, unspecified, unspecified trimester: Secondary | ICD-10-CM

## 2020-09-16 DIAGNOSIS — O4693 Antepartum hemorrhage, unspecified, third trimester: Secondary | ICD-10-CM

## 2020-09-16 LAB — WET PREP, GENITAL
Clue Cells Wet Prep HPF POC: NONE SEEN
Sperm: NONE SEEN
Trich, Wet Prep: NONE SEEN
Yeast Wet Prep HPF POC: NONE SEEN

## 2020-09-16 LAB — URINALYSIS, ROUTINE W REFLEX MICROSCOPIC
Bacteria, UA: NONE SEEN
Bilirubin Urine: NEGATIVE
Glucose, UA: NEGATIVE mg/dL
Hgb urine dipstick: NEGATIVE
Ketones, ur: 20 mg/dL — AB
Nitrite: NEGATIVE
Protein, ur: 30 mg/dL — AB
Specific Gravity, Urine: 1.023 (ref 1.005–1.030)
pH: 6 (ref 5.0–8.0)

## 2020-09-16 NOTE — MAU Note (Signed)
Patient reports to MAU with complaints of increased stomach pressure. This morning patient reports cramping followed by bleeding when she went to the bathroom at 0930.  Since this time there has not been any blood seen and cramping has subsided. Fetal movement present.

## 2020-09-16 NOTE — Discharge Instructions (Signed)

## 2020-09-16 NOTE — MAU Provider Note (Signed)
History     CSN: [id number]  Arrival date and time: 09/16/20 1010   Event Date/Time   First Provider Initiated Contact with Patient 09/16/20 1052      Chief Complaint  Patient presents with   Vaginal Bleeding    Patient stated at 0930 she went to the bathroom at work and bleeding was like a period.    Ms. Stephanie BurkittChasity T Velazquez is a [age] y.o. G1P0000 at 4864w1d who presents to MAU for vaginal bleeding which began this morning. Patient reports she saw some dark red blood after wiping when using the bathroom around 930AM this morning. Patient also reports wearing a pad and states there were a few drops of blood on it. Patient reports cramping was present earlier today, but denies any cramping at this time. Patient reports there was a very tiny spot of blood when wiping after using the restroom in MAU. Patient reports she does have hemorrhoids.  Blood Type? AB Positive   OB History     Gravida  1   Para  0   Term  0   Preterm  0   AB  0   Living  0      SAB  0   IAB  0   Ectopic  0   Multiple  0   Live Births  0           Past Medical History:  Diagnosis Date   Anxiety    Depression    Just started back on meds   Headache    Infection    UTI    Past Surgical History:  Procedure Laterality Date   WISDOM TOOTH EXTRACTION  04/02/2016    Family History  Problem Relation Age of Onset   Healthy Father    Healthy Mother     Social History   Tobacco Use   Smoking status: Never   Smokeless tobacco: Never  Vaping Use   Vaping Use: Never used  Substance Use Topics   Alcohol use: No   Drug use: No    Allergies:  Allergies  Allergen Reactions   Flagyl [Metronidazole] Hives   Monistat [Miconazole] Hives    Burning inside    Medications Prior to Admission  Medication Sig Dispense Refill Last Dose   cefadroxil (DURICEF) 500 MG capsule Take 1 capsule (500 mg total) by mouth 2 (two) times daily. 14 capsule 0    Prenatal Vit-Fe Fumarate-FA  (MULTIVITAMIN-PRENATAL) 27-0.8 MG TABS tablet Take 1 tablet by mouth daily at 12 noon.       Review of Systems  Constitutional:  Negative for chills, diaphoresis, fatigue and fever.  Eyes:  Negative for visual disturbance.  Respiratory:  Negative for shortness of breath.   Cardiovascular:  Negative for chest pain.  Gastrointestinal:  Negative for abdominal pain, constipation, diarrhea, nausea and vomiting.  Genitourinary:  Positive for pelvic pain and vaginal bleeding. Negative for dysuria, flank pain, frequency, urgency and vaginal discharge.  Neurological:  Negative for dizziness, weakness, light-headedness and headaches.   Physical Exam   Blood pressure 122/75, pulse 75, temperature 97.8 F (36.6 C), temperature source Oral, resp. rate 20, height 5\' 8"  (1.727 m), weight 77.6 kg.  Patient Vitals for the past 24 hrs:  BP Temp Temp src Pulse Resp Height Weight  09/16/20 1229 122/75 97.8 F (36.6 C) Oral 75 20 -- --  09/16/20 1158 122/75 97.6 F (36.4 C) Oral 75 20 -- --  09/16/20 1050 -- 99.1 F (37.3 C) Oral --  20  (1.727 m) 77.6 kg  09/16/20 1047 132/74 -- -- 71 -- -- --   Physical Exam Vitals and nursing note reviewed. Exam conducted with a chaperone present.  Constitutional:      General: She is not in acute distress.    Appearance: Normal appearance. She is not ill-appearing, toxic-appearing or diaphoretic.  HENT:     Head: Normocephalic and atraumatic.  Pulmonary:     Effort: Pulmonary effort is normal.  Genitourinary:    General: Normal vulva.     Labia:        Right: No rash, tenderness or lesion.        Left: No rash, tenderness or lesion.      Vagina: Normal. No tenderness or bleeding.     Cervix: Friability present. No discharge, lesion or cervical bleeding.     Comments: Cervix visually closed. No bleeding noted, discharge perfectly white and smooth, no brown or red tinge or streaks. Skin:    General: Skin is warm and dry.  Neurological:     Mental  Status: She is alert and oriented to person, place, and time.  Psychiatric:        Mood and Affect: Mood normal.        Behavior: Behavior normal.        Thought Content: Thought content normal.        Judgment: Judgment normal.   Results for orders placed or performed during the hospital encounter of 09/16/20 (from the past 24 hour(s))  Wet prep, genital     Status: Abnormal   Collection Time: 09/16/20 11:00 AM   Specimen: Urine, Clean Catch  Result Value Ref Range   Yeast Wet Prep HPF POC NONE SEEN NONE SEEN   Trich, Wet Prep NONE SEEN NONE SEEN   Clue Cells Wet Prep HPF POC NONE SEEN NONE SEEN   WBC, Wet Prep HPF POC MODERATE (A) NONE SEEN   Sperm NONE SEEN   Urinalysis, Routine w reflex microscopic Urine, Clean Catch     Status: Abnormal   Collection Time: 09/16/20 11:11 AM  Result Value Ref Range   Color, Urine YELLOW YELLOW   APPearance HAZY (A) CLEAR   Specific Gravity, Urine 1.023 1.005 - 1.030   pH 6.0 5.0 - 8.0   Glucose, UA NEGATIVE NEGATIVE mg/dL   Hgb urine dipstick NEGATIVE NEGATIVE   Bilirubin Urine NEGATIVE NEGATIVE   Ketones, ur 20 (A) NEGATIVE mg/dL   Protein, ur 30 (A) NEGATIVE mg/dL   Nitrite NEGATIVE NEGATIVE   Leukocytes,Ua TRACE (A) NEGATIVE   RBC / HPF 0-5 0 - 5 RBC/hpf   WBC, UA 0-5 0 - 5 WBC/hpf   Bacteria, UA NONE SEEN NONE SEEN   Squamous Epithelial / LPF 6-10 0 - 5   Mucus PRESENT    [place] MFM OB FOLLOW UP  Result Date: 09/02/2020 ----------------------------------------------------------------------  OBSTETRICS REPORT                       (Signed Final 09/02/2020 04:17 pm) ---------------------------------------------------------------------- Patient Info  ID #:       [id number]                          D.O.B.:  08-07-2000 (19 yrs)  Name:       Stephanie Velazquez                  Visit Date: 09/02/2020 03:50 pm ----------------------------------------------------------------------  Performed By  Attending:        Lin Landsman      Secondary Phy.:   [facility]  Mila Merry                    MD  Performed By:     Clayton Lefort RDMS       Address:          [address]                                                             [address]  Referred By:      Roxy Cedar             [facility] ---------------------------------------------------------------------- Orders  #  Description                           Code        Ordered By  1  [place] MFM OB FOLLOW UP                   76816.01    [facility] ----------------------------------------------------------------------  #  Order #                     Accession #                Episode #  1  [id number]                   [id number]                 [id number] ---------------------------------------------------------------------- Indications  [redacted] weeks gestation of pregnancy                Z3A.28  Low Risk NIPS(Negative AFP)(Negative  [place] 4/4)  Antenatal follow-up for nonvisualized fetal    Z36.2  anatomy  Marginal insertion of umbilical cord affecting O43.193  management of mother in third trimester ---------------------------------------------------------------------- Fetal Evaluation  Num Of Fetuses:         1  Fetal Heart Rate(bpm):  129  Cardiac Activity:       Observed  Presentation:  Cephalic  Placenta:               Anterior  P. Cord Insertion:      Previously Visualized  Amniotic Fluid  AFI FV:      Within normal limits  AFI Sum(cm)     %Tile       Largest Pocket(cm)  15.92           57          5.17  RUQ(cm)       RLQ(cm)       LUQ(cm)        LLQ(cm)  5.17          3.75          3.6            3.4 ---------------------------------------------------------------------- Biometry  BPD:      73.4  mm     G. Age:  29w 3d         79  %    CI:        72.22   %    70 - 86                                                           FL/HC:      20.1   %    18.8 - 20.6  HC:      274.8  mm     G. Age:  30w 0d         77  %    HC/AC:      1.08        1.05 - 1.21  AC:       255   mm     G. Age:  29w 5d         85  %    FL/BPD:     75.3   %    71 - 87  FL:       55.3  mm     G. Age:  29w 1d         65  %    FL/AC:      21.7   %    20 - 24  LV:        5.2  mm  Est. FW:    1408  gm      3 lb 2 oz     87  % ---------------------------------------------------------------------- OB History  Gravidity:    1 ---------------------------------------------------------------------- Gestational Age  U/S Today:     29w 4d                                        EDD:   11/14/20  Best:          28w 1d     Det. ByMarcella Dubs         EDD:   11/24/20                                      (04/08/20) ---------------------------------------------------------------------- Anatomy  Cranium:  Appears normal         LVOT:                   Previously seen  Cavum:                 Previously seen        Aortic Arch:            Previously seen  Ventricles:            Appears normal         Ductal Arch:            Previously seen  Choroid Plexus:        Previously seen        Diaphragm:              Previously seen  Cerebellum:            Previously seen        Stomach:                Appears normal, left                                                                        sided  Posterior Fossa:       Previously seen        Abdomen:                Previously seen  Nuchal Fold:           Previously seen        Abdominal Wall:         Previously seen  Face:                  Orbits and profile     Cord Vessels:           Appears normal (3                         previously seen                                vessel cord)  Lips:                  Previously seen        Kidneys:                Appear normal  Palate:                Previously seen        Bladder:                Appears normal  Thoracic:               Previously seen        Spine:                  Previously seen  Heart:                 Appears normal         Upper Extremities:      Previously  seen                         (4CH, axis, and                         situs)  RVOT:                  Previously seen        Lower Extremities:      Previously seen  Other:  Female gender previously seen.Heels previously visualized VC, 3VV          and 3VTV visualized. ---------------------------------------------------------------------- Cervix Uterus Adnexa  Cervix  Length:            4.5  cm.  Not visualized (advanced GA >24wks)  Right Ovary  Visualized.  Left Ovary  Not visualized. ---------------------------------------------------------------------- Impression  Follow up growth due to marginal cord insertion.  Normal interval growth with measurements consistent with  dates  Good fetal movement and amniotic fluid volume ---------------------------------------------------------------------- Recommendations  Follow up growth scheduled in 4 weeks. ----------------------------------------------------------------------               Lin Landsman, MD Electronically Signed Final Report   09/02/2020 04:17 pm ----------------------------------------------------------------------   MAU Course  Procedures  MDM -report of VB without evidence of VB on exam -pt without cramping/abdominal pain -UA: hazy/20ketones/30PRO/trace leuks, sending urine for culture, pt reports just finished ABX for GBS+ urine EFM: reactive       -baseline: 120       -variability: moderate       -accels: present, 15x15       -decels: absent       -TOCO: quiet -WetPrep: WNL -GC/CT collected -pt discharged to home in stable condition  Orders Placed This Encounter  Procedures   Culture, OB Urine    Standing Status:   Standing    Number of Occurrences:   1   Wet prep, genital    Standing Status:   Standing    Number of Occurrences:   1   Urinalysis, Routine w reflex microscopic     Standing Status:   Standing    Number of Occurrences:   1   Discharge patient    Order Specific Question:   Discharge disposition    Answer:   01-Home or Self Care [1]    Order Specific Question:   Discharge patient date    Answer:   09/16/2020   No orders of the defined types were placed in this encounter.  Assessment and Plan   1. Vaginal bleeding in pregnancy   2. Supervision of normal first pregnancy, antepartum   3. Blood type, Rh positive   4. [redacted] weeks gestation of pregnancy   5. NST (non-stress test) reactive    Allergies as of 09/16/2020       Reactions   Flagyl [metronidazole] Hives   Monistat [miconazole] Hives   Burning inside        Medication List     TAKE these medications    cefadroxil 500 MG capsule Commonly known as: DURICEF Take 1 capsule (500 mg total) by mouth 2 (two) times daily.   multivitamin-prenatal 27-0.8 MG Tabs tablet Take 1 tablet by mouth daily at 12 noon.       -return MAU precautions given -pt discharged to home in stable condition  Joni Reining E Leanna Hamid 09/16/2020, 12:31 PM

## 2020-09-16 NOTE — Telephone Encounter (Signed)
Pt called stating she has dark, red bleeding. Pt noticed the blood in toilet, underwear, and on the toilet paper. Pt states she felt baby move this morning but it having lots of cramping. Pt advised to report to MAU for further evaluation. Informed pt I would let the providers know she is coming.

## 2020-09-17 LAB — GC/CHLAMYDIA PROBE AMP (~~LOC~~) NOT AT ARMC
Chlamydia: NEGATIVE
Comment: NEGATIVE
Comment: NORMAL
Neisseria Gonorrhea: NEGATIVE

## 2020-09-17 LAB — CULTURE, OB URINE: Culture: NO GROWTH

## 2020-09-18 ENCOUNTER — Ambulatory Visit (INDEPENDENT_AMBULATORY_CARE_PROVIDER_SITE_OTHER): Payer: Medicaid Other | Admitting: Obstetrics and Gynecology

## 2020-09-18 ENCOUNTER — Other Ambulatory Visit: Payer: Self-pay

## 2020-09-18 VITALS — BP 124/83 | HR 90 | Wt 173.4 lb

## 2020-09-18 DIAGNOSIS — Z3A30 30 weeks gestation of pregnancy: Secondary | ICD-10-CM

## 2020-09-18 DIAGNOSIS — Z3400 Encounter for supervision of normal first pregnancy, unspecified trimester: Secondary | ICD-10-CM

## 2020-09-18 DIAGNOSIS — R8271 Bacteriuria: Secondary | ICD-10-CM

## 2020-09-18 DIAGNOSIS — O43199 Other malformation of placenta, unspecified trimester: Secondary | ICD-10-CM

## 2020-09-18 DIAGNOSIS — O0990 Supervision of high risk pregnancy, unspecified, unspecified trimester: Secondary | ICD-10-CM

## 2020-09-18 DIAGNOSIS — O2300 Infections of kidney in pregnancy, unspecified trimester: Secondary | ICD-10-CM

## 2020-09-18 NOTE — Progress Notes (Addendum)
     PRENATAL VISIT NOTE  Subjective:  Stephanie Velazquez is a 20 y.o. G1P0000 at [redacted]w[redacted]d being seen today for ongoing prenatal care.  She is currently monitored for the following issues for this low-risk pregnancy and has Encounter for supervision of normal first pregnancy; UTI in pregnancy; Pyelonephritis affecting pregnancy; GBS bacteriuria; and Marginal insertion of umbilical cord affecting management of mother on their problem list.  Patient has some questions about her MAU visit.  Contractions: Not present. Vag. Bleeding: None.  Movement: Present. Denies leaking of fluid.   The following portions of the patient's history were reviewed and updated as appropriate: allergies, current medications, past family history, past medical history, past social history, past surgical history and problem list.   Objective:   Vitals:   09/18/20 1551  BP: 124/83  Pulse: 90  Weight: 173 lb 6.4 oz (78.7 kg)    Fetal Status: Fetal Heart Rate (bpm): 143   Movement: Present     General:  Alert, oriented and cooperative. Patient is in no acute distress.  Skin: Skin is warm and dry. No rash noted.   Cardiovascular: Normal heart rate noted  Respiratory: Normal respiratory effort, no problems with respiration noted  Abdomen: Soft, gravid, appropriate for gestational age.  Pain/Pressure: Absent     Pelvic: Cervical exam deferred        Extremities: Normal range of motion.     Mental Status: Normal mood and affect. Normal behavior. Normal judgment and thought content.   Assessment and Plan:  Pregnancy: G1P0000 at [redacted]w[redacted]d 1. [redacted] weeks gestation of pregnancy  2. Marginal insertion of umbilical cord affecting management of mother Continue with q4wk growth u/s. Next on on 8/8.  7/12: 87%, afi 16, cephalic.   D/w her re: recommendation for 39-40wk IOL, but I did tell her that if she wanted to go to 41wks that that can be considered with reassuring testing. Will d/w her more at later visits  3. GBS  bacteriuria Neg toc. Tx in labor  4. Pyelonephritis affecting pregnancy, antepartum Neg 7/26 ucx. Recommend qmonth surveillance ucx.   5. Supervision of normal first pregnancy, antepartum I reviewed with her re: her last mau visit on 7/26   Preterm labor symptoms and general obstetric precautions including but not limited to vaginal bleeding, contractions, leaking of fluid and fetal movement were reviewed in detail with the patient. Please refer to After Visit Summary for other counseling recommendations.   Return in about 2 weeks (around 10/02/2020) for in person, md or app, low risk ob.  Future Appointments  Date Time Provider Department Center  09/29/2020  3:30 PM [facility] NURSE [facility]  09/29/2020  3:45 PM [facility] US5 [facility]  10/02/2020  4:10 PM [place] Bing, MD [facility]  10/16/2020  4:10 PM Reva Bores, MD [facility]  10/28/2020  3:50 PM [place] Bing, MD [facility]  11/06/2020  8:15 AM [place] Bing, MD [facility]  11/11/2020  3:50 PM [place] Bing, MD [facility]  11/17/2020  1:10 PM [place] Bing, MD [facility]    [place] Bing, MD

## 2020-09-29 ENCOUNTER — Other Ambulatory Visit: Payer: Self-pay

## 2020-09-29 ENCOUNTER — Ambulatory Visit: Payer: Medicaid Other | Attending: Maternal & Fetal Medicine

## 2020-09-29 ENCOUNTER — Ambulatory Visit: Payer: Medicaid Other | Admitting: *Deleted

## 2020-09-29 ENCOUNTER — Encounter: Payer: Self-pay | Admitting: *Deleted

## 2020-09-29 VITALS — BP 129/80 | HR 74

## 2020-09-29 DIAGNOSIS — Z362 Encounter for other antenatal screening follow-up: Secondary | ICD-10-CM

## 2020-09-29 DIAGNOSIS — Z3400 Encounter for supervision of normal first pregnancy, unspecified trimester: Secondary | ICD-10-CM | POA: Diagnosis present

## 2020-09-29 DIAGNOSIS — O43199 Other malformation of placenta, unspecified trimester: Secondary | ICD-10-CM | POA: Insufficient documentation

## 2020-09-29 DIAGNOSIS — Z3A32 32 weeks gestation of pregnancy: Secondary | ICD-10-CM | POA: Diagnosis not present

## 2020-09-29 DIAGNOSIS — O43193 Other malformation of placenta, third trimester: Secondary | ICD-10-CM

## 2020-09-30 ENCOUNTER — Other Ambulatory Visit: Payer: Self-pay | Admitting: *Deleted

## 2020-09-30 DIAGNOSIS — O3663X0 Maternal care for excessive fetal growth, third trimester, not applicable or unspecified: Secondary | ICD-10-CM

## 2020-10-02 ENCOUNTER — Other Ambulatory Visit: Payer: Self-pay

## 2020-10-02 ENCOUNTER — Ambulatory Visit (INDEPENDENT_AMBULATORY_CARE_PROVIDER_SITE_OTHER): Payer: Medicaid Other | Admitting: Obstetrics and Gynecology

## 2020-10-02 VITALS — BP 133/86 | HR 79 | Wt 180.0 lb

## 2020-10-02 DIAGNOSIS — O3660X0 Maternal care for excessive fetal growth, unspecified trimester, not applicable or unspecified: Secondary | ICD-10-CM

## 2020-10-02 DIAGNOSIS — R600 Localized edema: Secondary | ICD-10-CM

## 2020-10-02 LAB — POCT URINALYSIS DIPSTICK: Protein, UA: POSITIVE — AB

## 2020-10-02 NOTE — Patient Instructions (Signed)
20-30 mmHg

## 2020-10-02 NOTE — Progress Notes (Signed)
   PRENATAL VISIT NOTE  Subjective:  Stephanie Velazquez is a 20 y.o. G1P0000 at [redacted]w[redacted]d being seen today for ongoing prenatal care.  She is currently monitored for the following issues for this low-risk pregnancy and has Encounter for supervision of normal first pregnancy; UTI in pregnancy; Pyelonephritis affecting pregnancy; GBS bacteriuria; and Marginal insertion of umbilical cord affecting management of mother on their problem list.  Patient reports  LE edema .  Contractions: Not present. Vag. Bleeding: None.  Movement: Present. Denies leaking of fluid.   The following portions of the patient's history were reviewed and updated as appropriate: allergies, current medications, past family history, past medical history, past social history, past surgical history and problem list.   Objective:   Vitals:   10/02/20 1557  BP: 133/86  Pulse: 79  Weight: 180 lb (81.6 kg)    Fetal Status: Fetal Heart Rate (bpm): 125   Movement: Present     General:  Alert, oriented and cooperative. Patient is in no acute distress.  Skin: Skin is warm and dry. No rash noted.   Cardiovascular: Normal heart rate noted  Respiratory: Normal respiratory effort, no problems with respiration noted  Abdomen: Soft, gravid, appropriate for gestational age.  Pain/Pressure: Present     Pelvic: Cervical exam deferred        Extremities: Normal range of motion.  Edema: Moderate pitting, indentation subsides rapidly. 1+ b/l LE edema  Mental Status: Normal mood and affect. Normal behavior. Normal judgment and thought content.   Assessment and Plan:  Pregnancy: G1P0000 at [redacted]w[redacted]d 1. Excessive fetal growth affecting management of pregnancy, antepartum, single or unspecified fetus 8/8: 2502g, 99%, ac 99%, afi 12 Normal 2h GTT. F/u 38wk growth u/s  2. Marginal cord insertion Delivery at 39-40wks  3. Supervision of normal pregnancy Trace protein on udip. Pre-x precautions given. Pt states BP cuff at home doesn't work well. Will  do one week rn bp check and recommend udips q visit  4. Lower extremity edema TED hoses/compression stockings - POCT Urinalysis Dipstick  Preterm labor symptoms and general obstetric precautions including but not limited to vaginal bleeding, contractions, leaking of fluid and fetal movement were reviewed in detail with the patient. Please refer to After Visit Summary for other counseling recommendations.   Return in about 1 week (around 10/09/2020) for rn bp check. 2wk low risk ob visit.  Future Appointments  Date Time Provider Department Center  10/09/2020  4:15 PM [facility] NURSE [facility]  10/16/2020  4:10 PM Reva Bores, MD [facility]  10/28/2020  3:50 PM [place] Bing, MD [facility]  11/06/2020  8:15 AM [place] Bing, MD [facility]  11/10/2020  3:30 PM [facility] NURSE [facility]  11/10/2020  3:45 PM [facility] US1 [facility]  11/11/2020  3:50 PM [place] Bing, MD [facility]  11/17/2020  1:10 PM [place] Bing, MD [facility]     Bing, MD

## 2020-10-03 ENCOUNTER — Inpatient Hospital Stay (HOSPITAL_COMMUNITY)
Admission: AD | Admit: 2020-10-03 | Discharge: 2020-10-03 | Disposition: A | Discharge status: Home / Self Care | Payer: Medicaid Other | Attending: Obstetrics & Gynecology | Admitting: Obstetrics & Gynecology

## 2020-10-03 ENCOUNTER — Encounter (HOSPITAL_COMMUNITY): Payer: Self-pay | Admitting: Obstetrics & Gynecology

## 2020-10-03 ENCOUNTER — Telehealth: Payer: Self-pay

## 2020-10-03 DIAGNOSIS — R519 Headache, unspecified: Secondary | ICD-10-CM | POA: Diagnosis not present

## 2020-10-03 DIAGNOSIS — O26893 Other specified pregnancy related conditions, third trimester: Secondary | ICD-10-CM | POA: Insufficient documentation

## 2020-10-03 DIAGNOSIS — O1493 Unspecified pre-eclampsia, third trimester: Secondary | ICD-10-CM | POA: Diagnosis present

## 2020-10-03 DIAGNOSIS — O133 Gestational [pregnancy-induced] hypertension without significant proteinuria, third trimester: Secondary | ICD-10-CM | POA: Insufficient documentation

## 2020-10-03 DIAGNOSIS — R110 Nausea: Secondary | ICD-10-CM | POA: Insufficient documentation

## 2020-10-03 DIAGNOSIS — Z3A32 32 weeks gestation of pregnancy: Secondary | ICD-10-CM | POA: Insufficient documentation

## 2020-10-03 DIAGNOSIS — O163 Unspecified maternal hypertension, third trimester: Secondary | ICD-10-CM | POA: Diagnosis present

## 2020-10-03 DIAGNOSIS — R1030 Lower abdominal pain, unspecified: Secondary | ICD-10-CM | POA: Insufficient documentation

## 2020-10-03 DIAGNOSIS — R030 Elevated blood-pressure reading, without diagnosis of hypertension: Secondary | ICD-10-CM

## 2020-10-03 DIAGNOSIS — O1400 Mild to moderate pre-eclampsia, unspecified trimester: Secondary | ICD-10-CM | POA: Diagnosis present

## 2020-10-03 LAB — COMPREHENSIVE METABOLIC PANEL
ALT: 12 U/L (ref 0–44)
AST: 18 U/L (ref 15–41)
Albumin: 3.1 g/dL — ABNORMAL LOW (ref 3.5–5.0)
Alkaline Phosphatase: 87 U/L (ref 38–126)
Anion gap: 8 (ref 5–15)
BUN: <5 mg/dL — ABNORMAL LOW (ref 6–20)
CO2: 20 mmol/L — ABNORMAL LOW (ref 22–32)
Calcium: 8.9 mg/dL (ref 8.9–10.3)
Chloride: 107 mmol/L (ref 98–111)
Creatinine, Ser: 0.61 mg/dL (ref 0.44–1.00)
GFR, Estimated: >60 mL/min (ref >60)
Glucose, Bld: 75 mg/dL (ref 70–99)
Potassium: 3.9 mmol/L (ref 3.5–5.1)
Sodium: 135 mmol/L (ref 135–145)
Total Bilirubin: 0.5 mg/dL (ref 0.3–1.2)
Total Protein: 6.4 g/dL — ABNORMAL LOW (ref 6.5–8.1)

## 2020-10-03 LAB — WET PREP, GENITAL
Clue Cells Wet Prep HPF POC: NONE SEEN
Sperm: NONE SEEN
Trich, Wet Prep: NONE SEEN
WBC, Wet Prep HPF POC: NONE SEEN
Yeast Wet Prep HPF POC: NONE SEEN

## 2020-10-03 LAB — PROTEIN / CREATININE RATIO, URINE
Creatinine, Urine: 43.36 mg/dL
Protein Creatinine Ratio: 0.16 mg/mg{Cre} — ABNORMAL HIGH (ref 0.00–0.15)
Total Protein, Urine: 7 mg/dL

## 2020-10-03 LAB — CBC
HCT: 30 % — ABNORMAL LOW (ref 36.0–46.0)
Hemoglobin: 9.5 g/dL — ABNORMAL LOW (ref 12.0–15.0)
MCH: 25 pg — ABNORMAL LOW (ref 26.0–34.0)
MCHC: 31.7 g/dL (ref 30.0–36.0)
MCV: 78.9 fL — ABNORMAL LOW (ref 80.0–100.0)
Platelets: 243 10*3/uL (ref 150–400)
RBC: 3.8 MIL/uL — ABNORMAL LOW (ref 3.87–5.11)
RDW: 14 % (ref 11.5–15.5)
WBC: 12.3 10*3/uL — ABNORMAL HIGH (ref 4.0–10.5)
nRBC: 0 % (ref 0.0–0.2)

## 2020-10-03 LAB — URINALYSIS, ROUTINE W REFLEX MICROSCOPIC
Bilirubin Urine: NEGATIVE
Glucose, UA: NEGATIVE mg/dL
Hgb urine dipstick: NEGATIVE
Ketones, ur: NEGATIVE mg/dL
Leukocytes,Ua: NEGATIVE
Nitrite: NEGATIVE
Protein, ur: NEGATIVE mg/dL
Specific Gravity, Urine: 1.005 (ref 1.005–1.030)
pH: 7 (ref 5.0–8.0)

## 2020-10-03 MED ORDER — ACETAMINOPHEN 500 MG PO TABS
1000.0000 mg | ORAL_TABLET | Freq: Once | ORAL | Status: AC
  Administered 2020-10-03: 1000 mg via ORAL
  Filled 2020-10-03: qty 2

## 2020-10-03 NOTE — Telephone Encounter (Signed)
Pt states bp this morning was 146/91 Pt c/o severe headache, states she took tylenol around 4:45am and has nausea. \ Asked pt to repeat bp, at 9:28am bp was 145/97  Pt advised to report to MAU for evaluation. Pt states she is working until 2:30pm today, advised pt I would rather her go now instead of waiting.

## 2020-10-03 NOTE — MAU Provider Note (Addendum)
Obstetric Attending MAU Note  Chief Complaint:  Headache and Nausea  Event Date/Time   First Provider Initiated Contact with Patient 10/03/20 1209     HPI: Stephanie Velazquez is a 20 y.o. G1P0 at [redacted]w[redacted]d who presents to MAU reporting having elevated BP today at work to 151/102. Also had headache earlier, took Tylenol around 0445, did not help much. Also reports nausea and lower abdominal cramping. No visual changes, RUQ/epigastric pain. Rate headache now at 4/10.  Denies overt contractions, leakage of fluid or vaginal bleeding. Good fetal movement.   Pregnancy Course: Receives care at [facility]. Of note, no other elevated BP recently but had elevated BP on two occasions in early first trimester [redacted]w[redacted]d 154/88, [redacted]w[redacted]d 146/85 and normal BP since then until today.  She reported that she was scared and this is why her BP was high, was not given a diagnosis of CHTN.  Denies any earlier elevated BPs.   Patient Active Problem List   Diagnosis Date Noted   Elevated blood pressure affecting pregnancy in third trimester, antepartum 10/03/2020   Marginal insertion of umbilical cord affecting management of mother 08/28/2020   GBS bacteriuria 06/18/2020   Pyelonephritis affecting pregnancy 06/16/2020   UTI in pregnancy 05/07/2020   Encounter for supervision of normal first pregnancy 04/08/2020    Past Medical History:  Diagnosis Date   Anxiety    Depression    Just started back on meds   Headache    Infection    UTI    OB History  Gravida Para Term Preterm AB Living  1 0 0 0 0 0  SAB IAB Ectopic Multiple Live Births  0 0 0 0 0    # Outcome Date GA Lbr Len/2nd Weight Sex Delivery Anes PTL Lv  1 Current             Past Surgical History:  Procedure Laterality Date   WISDOM TOOTH EXTRACTION  04/02/2016    Family History: Family History  Problem Relation Age of Onset   Healthy Father    Healthy Mother     Social History: Social History   Tobacco Use   Smoking status: Never   Smokeless tobacco:  Never  Vaping Use   Vaping Use: Never used  Substance Use Topics   Alcohol use: No   Drug use: No    Allergies:  Allergies  Allergen Reactions   Flagyl [Metronidazole] Hives   Monistat [Miconazole] Hives    Burning inside    Medications Prior to Admission  Medication Sig Dispense Refill Last Dose   Prenatal Vit-Fe Fumarate-FA (MULTIVITAMIN-PRENATAL) 27-0.8 MG TABS tablet Take 1 tablet by mouth daily at 12 noon.   10/02/2020   cefadroxil (DURICEF) 500 MG capsule Take 1 capsule (500 mg total) by mouth 2 (two) times daily. (Patient not taking: Reported on 09/18/2020) 14 capsule 0     ROS: Pertinent findings in history of present illness.  Physical Exam  Blood pressure 129/76, pulse 70, temperature 98.2 F (36.8 C), temperature source Oral, resp. rate 18, height 5\' 8"  (1.727 m), weight 81.6 kg, SpO2 97 %. CONSTITUTIONAL: Well-developed, well-nourished female in no acute distress.  HENT:  Normocephalic, atraumatic, External right and left ear normal. EYES: Conjunctivae and EOM are normal. Pupils are equal, round, and reactive to light. No scleral icterus.  NECK: Normal range of motion, supple, no masses SKIN: Skin is warm and dry. No rash noted. Not diaphoretic. No erythema. No pallor. NEUROLGIC: Alert and oriented to person, place, and time.  Normal reflexes, muscle tone coordination. No cranial nerve deficit noted. PSYCHIATRIC: Normal mood and affect. Normal behavior. Normal judgment and thought content. CARDIOVASCULAR: Normal heart rate noted, regular rhythm RESPIRATORY: Effort and breath sounds normal, no problems with respiration noted ABDOMEN: Soft, nontender, nondistended, gravid appropriate for gestational age MUSCULOSKELETAL: Normal range of motion. No edema and no tenderness. 2+ distal pulses.  SPECULUM EXAM: NEFG, physiologic discharge, no blood, cervix clean Dilation: Closed Exam by:: Dr. Macon LargeAnyanwu  FHT:  Baseline 140 , moderate variability, accelerations present, no  decelerations Contractions: q 5-7 mins   Labs: Results for orders placed or performed during the hospital encounter of 10/03/20 (from the past 24 hour(s))  Urinalysis, Routine w reflex microscopic Urine, Clean Catch     Status: Abnormal   Collection Time: 10/03/20 11:07 AM  Result Value Ref Range   Color, Urine YELLOW YELLOW   APPearance HAZY (A) CLEAR   Specific Gravity, Urine 1.005 1.005 - 1.030   pH 7.0 5.0 - 8.0   Glucose, UA NEGATIVE NEGATIVE mg/dL   Hgb urine dipstick NEGATIVE NEGATIVE   Bilirubin Urine NEGATIVE NEGATIVE   Ketones, ur NEGATIVE NEGATIVE mg/dL   Protein, ur NEGATIVE NEGATIVE mg/dL   Nitrite NEGATIVE NEGATIVE   Leukocytes,Ua NEGATIVE NEGATIVE  Protein / creatinine ratio, urine     Status: Abnormal   Collection Time: 10/03/20 11:45 AM  Result Value Ref Range   Creatinine, Urine 43.36 mg/dL   Total Protein, Urine 7 mg/dL   Protein Creatinine Ratio 0.16 (H) 0.00 - 0.15 mg/mg[Cre]  CBC     Status: Abnormal   Collection Time: 10/03/20 11:54 AM  Result Value Ref Range   WBC 12.3 (H) 4.0 - 10.5 K/uL   RBC 3.80 (L) 3.87 - 5.11 MIL/uL   Hemoglobin 9.5 (L) 12.0 - 15.0 g/dL   HCT 16.130.0 (L) 09.636.0 - 04.546.0 %   MCV 78.9 (L) 80.0 - 100.0 fL   MCH 25.0 (L) 26.0 - 34.0 pg   MCHC 31.7 30.0 - 36.0 g/dL   RDW 40.914.0 81.111.5 - 91.415.5 %   Platelets 243 150 - 400 K/uL   nRBC 0.0 0.0 - 0.2 %  Comprehensive metabolic panel     Status: Abnormal   Collection Time: 10/03/20 11:54 AM  Result Value Ref Range   Sodium 135 135 - 145 mmol/L   Potassium 3.9 3.5 - 5.1 mmol/L   Chloride 107 98 - 111 mmol/L   CO2 20 (L) 22 - 32 mmol/L   Glucose, Bld 75 70 - 99 mg/dL   BUN <5 (L) 6 - 20 mg/dL   Creatinine, Ser 7.820.61 0.44 - 1.00 mg/dL   Calcium 8.9 8.9 - 95.610.3 mg/dL   Total Protein 6.4 (L) 6.5 - 8.1 g/dL   Albumin 3.1 (L) 3.5 - 5.0 g/dL   AST 18 15 - 41 U/L   ALT 12 0 - 44 U/L   Alkaline Phosphatase 87 38 - 126 U/L   Total Bilirubin 0.5 0.3 - 1.2 mg/dL   GFR, Estimated >21>60 >30>60 mL/min   Anion  gap 8 5 - 15  Wet prep, genital     Status: None   Collection Time: 10/03/20 12:14 PM   Specimen: Vaginal; Genital  Result Value Ref Range   Yeast Wet Prep HPF POC NONE SEEN NONE SEEN   Trich, Wet Prep NONE SEEN NONE SEEN   Clue Cells Wet Prep HPF POC NONE SEEN NONE SEEN   WBC, Wet Prep HPF POC NONE SEEN NONE SEEN   Sperm  NONE SEEN     Imaging:  [place] MFM OB FOLLOW UP  Result Date: 09/29/2020 ----------------------------------------------------------------------  OBSTETRICS REPORT                       (Signed Final 09/29/2020 04:35 pm) ---------------------------------------------------------------------- Patient Info  ID #:       [id number]                          D.O.B.:  [birth year] (19 yrs)  Name:       Stephanie Velazquez                  Visit Date: 09/29/2020 04:00 pm ---------------------------------------------------------------------- Performed By  Attending:        Ma Rings MD         Secondary Phy.:   [facility]  Performed By:     Jeanmarie Plant       Address:          [address]                    [address]                                                             [address]  Referred By:      Roxy Cedar             [facility] ---------------------------------------------------------------------- Orders  #  Description                           Code        Ordered By  1  [place] MFM OB FOLLOW UP                   [id number]    CORENTHIAN  BOOKER ----------------------------------------------------------------------  #  Order #                     Accession #                Episode #  1  [id number]                   [id number]                 [id number]  ---------------------------------------------------------------------- Indications  Marginal insertion of umbilical cord affecting O43.193  management of mother in third trimester  [redacted] weeks gestation of pregnancy                Z3A.32  Low Risk NIPS(Negative AFP)(Negative  [place] 4/4)  Antenatal follow-up for nonvisualized fetal    Z36.2  anatomy ---------------------------------------------------------------------- Fetal Evaluation  Num Of Fetuses:         1  Preg. Location:         Intrauterine  Fetal Heart Rate(bpm):  129  Cardiac Activity:       Observed  Presentation:           Cephalic  Placenta:               Anterior  Amniotic Fluid  AFI FV:      Within normal limits  AFI Sum(cm)     %Tile       Largest Pocket(cm)  12.3            34          6.1  RUQ(cm)       RLQ(cm)       LUQ(cm)        LLQ(cm)  0             2.8           6.1            3.4 ---------------------------------------------------------------------- Biometry  BPD:      86.2  mm     G. Age:  34w 5d         97  %    CI:        74.05   %    70 - 86                                                          FL/HC:      20.6   %    19.1 - 21.3  HC:      318.1  mm     G. Age:  35w 6d         96  %    HC/AC:      1.02        0.96 - 1.17  AC:      310.8  mm     G. Age:  35w 0d         99  %    FL/BPD:     76.0   %    71 - 87  FL:       65.5  mm     G. Age:  33w 5d         82  %    FL/AC:  21.1   %    20 - 24  HUM:      56.5  mm     G. Age:  32w 6d         67  %  LV:        5.5  mm  Est. FW:    2502  gm      5 lb 8 oz     99  % ---------------------------------------------------------------------- OB History  Gravidity:    1 ---------------------------------------------------------------------- Gestational Age  U/S Today:     34w 6d                                        EDD:   11/04/20  Best:          Armida Sans 0d     Det. ByMarcella Dubs         EDD:   11/24/20                                      (04/08/20)  ---------------------------------------------------------------------- Anatomy  Cranium:               Appears normal         LVOT:                   Previously seen  Cavum:                 Appears normal         Aortic Arch:            Previously seen  Ventricles:            Appears normal         Ductal Arch:            Previously seen  Choroid Plexus:        Previously seen        Diaphragm:              Previously seen  Cerebellum:            Previously seen        Stomach:                Appears normal, left                                                                        sided  Posterior Fossa:       Previously seen        Abdomen:                Previously seen  Nuchal Fold:           Previously seen        Abdominal Wall:         Previously seen  Face:                  Orbits and profile     Cord Vessels:  Appears normal (3                         previously seen                                vessel cord)  Lips:                  Previously seen        Kidneys:                Appear normal  Palate:                Previously seen        Bladder:                Appears normal  Thoracic:              Previously seen        Spine:                  Previously seen  Heart:                 Limited Views          Upper Extremities:      Visualized  RVOT:                  Previously seen        Lower Extremities:      Visualized  Other:  Female gender previously seen.Heels previously visualized VC, 3VV          and 3VTV previously visualized. ---------------------------------------------------------------------- Cervix Uterus Adnexa  Cervix  Not visualized (advanced GA >24wks)  Uterus  No abnormality visualized.  Right Ovary  Not visualized.  Left Ovary  Not visualized.  Cul De Sac  No free fluid seen.  Adnexa  No abnormality visualized. ---------------------------------------------------------------------- Comments  This patient was seen for a follow up growth scan due to a  marginal placental cord  insertion that was noted during her  prior exams.  She denies any problems since her last exam  and has screened negative for gestational diabetes.  She was informed that the fetal growth measures large for  her gestational age (99th percentile).  There was normal  amniotic fluid noted.  A follow up exam was scheduled in 6 weeks to assess the  fetal growth closer to delivery. ----------------------------------------------------------------------                   Ma Rings, MD Electronically Signed Final Report   09/29/2020 04:35 pm ----------------------------------------------------------------------   MAU Course: PEC labs checked > normal Checked cervix, closed.  Pelvic cultures checked.  UA neg. Reactive NST Tylenol 1000 mg po given which helped with resolution of headache  Assessment: 1. Elevated blood pressure affecting pregnancy in third trimester, antepartum   2. [redacted] weeks gestation of pregnancy     Plan: Stable BPs, mild range, normal labs Closed cervix, wet prep negative, GC/Chlam pending Category 1 FHR tracing  Follow up in  on 10/06/20 for BP check. If still hypertensive, will meet criteria for at least [facility] and will need weekly antenatal testing, delivery at 37 weeks or earlier for any severe features; maternal-fetal instability. Preeclampsia precautions reviewed.  Advised to take Tylenol prn headache.  Preterm labor precautions and fetal kick counts reviewed Discharge home.   Allergies as of 10/03/2020  Reactions   Flagyl [metronidazole] Hives   Monistat [miconazole] Hives   Burning inside        Medication List     STOP taking these medications    cefadroxil 500 MG capsule Commonly known as: DURICEF       TAKE these medications    multivitamin-prenatal 27-0.8 MG Tabs tablet Take 1 tablet by mouth daily at 12 noon.        Tereso Newcomer, MD 10/03/2020 1:30 PM

## 2020-10-03 NOTE — MAU Note (Signed)
Has HTN, a HA and her ankles are swollen.  +protein in urine yesterday at appt. Monitoring BP at home, 151/102 at work.  Took some Tylenol at 0445 when getting ready for work, did not help.  Has been feeling really nauseous too. Denies visual changes or RUQ pain.

## 2020-10-06 ENCOUNTER — Other Ambulatory Visit: Payer: Self-pay

## 2020-10-06 ENCOUNTER — Ambulatory Visit (INDEPENDENT_AMBULATORY_CARE_PROVIDER_SITE_OTHER): Payer: Medicaid Other | Admitting: *Deleted

## 2020-10-06 DIAGNOSIS — O163 Unspecified maternal hypertension, third trimester: Secondary | ICD-10-CM

## 2020-10-06 LAB — GC/CHLAMYDIA PROBE AMP (~~LOC~~) NOT AT ARMC
Chlamydia: NEGATIVE
Comment: NEGATIVE
Comment: NORMAL
Neisseria Gonorrhea: NEGATIVE

## 2020-10-06 NOTE — Progress Notes (Signed)
Subjective:  Stephanie Velazquez is a 20 y.o. female here for BP check.   Hypertension ROS: no chest pain on exertion and no dyspnea on exertion.    Objective:  BP 128/85   Pulse 82   LMP  (LMP Unknown)   Appearance alert, well appearing, and in no distress. General exam BP noted to be well controlled today in office.    Assessment:   Blood Pressure well controlled.   Plan:  Follow up as needed and or at next ROB visit .

## 2020-10-07 NOTE — Progress Notes (Signed)
Patient seen and assessed by nursing staff.  Agree with documentation and plan.  

## 2020-10-09 ENCOUNTER — Ambulatory Visit: Payer: Medicaid Other

## 2020-10-16 ENCOUNTER — Encounter: Payer: Self-pay | Admitting: Family Medicine

## 2020-10-16 ENCOUNTER — Ambulatory Visit (INDEPENDENT_AMBULATORY_CARE_PROVIDER_SITE_OTHER): Payer: Medicaid Other | Admitting: Family Medicine

## 2020-10-16 VITALS — BP 136/85 | HR 93 | Wt 182.8 lb

## 2020-10-16 DIAGNOSIS — Z3A34 34 weeks gestation of pregnancy: Secondary | ICD-10-CM

## 2020-10-16 DIAGNOSIS — Z3403 Encounter for supervision of normal first pregnancy, third trimester: Secondary | ICD-10-CM

## 2020-10-16 DIAGNOSIS — D508 Other iron deficiency anemias: Secondary | ICD-10-CM

## 2020-10-16 DIAGNOSIS — F5089 Other specified eating disorder: Secondary | ICD-10-CM

## 2020-10-16 DIAGNOSIS — O43199 Other malformation of placenta, unspecified trimester: Secondary | ICD-10-CM

## 2020-10-16 DIAGNOSIS — O163 Unspecified maternal hypertension, third trimester: Secondary | ICD-10-CM

## 2020-10-16 LAB — POCT URINALYSIS DIPSTICK
Bilirubin, UA: NEGATIVE
Blood, UA: NEGATIVE
Glucose, UA: NEGATIVE
Ketones, UA: NEGATIVE
Leukocytes, UA: NEGATIVE
Nitrite, UA: NEGATIVE
Odor: NEGATIVE
Protein, UA: POSITIVE — AB
Spec Grav, UA: 1.01 (ref 1.010–1.025)
Urobilinogen, UA: 0.2 E.U./dL
pH, UA: 7 (ref 5.0–8.0)

## 2020-10-16 MED ORDER — FERROUS SULFATE 325 (65 FE) MG PO TABS: 325.0000 mg | ORAL_TABLET | ORAL | 1 refills | Status: DC

## 2020-10-16 NOTE — Patient Instructions (Signed)

## 2020-10-16 NOTE — Progress Notes (Signed)
Pt unable to void during intake.  Flu vaccine offered; pt declined.

## 2020-10-17 NOTE — Progress Notes (Signed)
   PRENATAL VISIT NOTE  Subjective:  Stephanie Velazquez is a 20 y.o. G1P0000 at [redacted]w[redacted]d being seen today for ongoing prenatal care.  She is currently monitored for the following issues for this low-risk pregnancy and has Encounter for supervision of normal first pregnancy; UTI in pregnancy; Pyelonephritis affecting pregnancy; GBS bacteriuria; Marginal insertion of umbilical cord affecting management of mother; and Elevated blood pressure affecting pregnancy in third trimester, antepartum on their problem list.  Patient reports no complaints.  Contractions: Irritability. Vag. Bleeding: None.  Movement: Present. Denies leaking of fluid.   The following portions of the patient's history were reviewed and updated as appropriate: allergies, current medications, past family history, past medical history, past social history, past surgical history and problem list.   Objective:   Vitals:   10/16/20 1602  BP: 136/85  Pulse: 93  Weight: 182 lb 12.8 oz (82.9 kg)    Fetal Status: Fetal Heart Rate (bpm): 154 Fundal Height: 33 cm Movement: Present     General:  Alert, oriented and cooperative. Patient is in no acute distress.  Skin: Skin is warm and dry. No rash noted.   Cardiovascular: Normal heart rate noted  Respiratory: Normal respiratory effort, no problems with respiration noted  Abdomen: Soft, gravid, appropriate for gestational age.  Pain/Pressure: Present     Pelvic: Cervical exam deferred        Extremities: Normal range of motion.  Edema: Moderate pitting, indentation subsides rapidly  Mental Status: Normal mood and affect. Normal behavior. Normal judgment and thought content.   Assessment and Plan:  Pregnancy: G1P0000 at [redacted]w[redacted]d 1. Encounter for supervision of normal first pregnancy in third trimester BP is trending up  2. [redacted] weeks gestation of pregnancy   3. Elevated blood pressure affecting pregnancy in third trimester, antepartum Some protein today--watch carefully--pre-eclampsia  precautions, if develops persistently elevated BP, then will need delivery plan at 37 weeks - POCT Urinalysis Dipstick  4. Marginal insertion of umbilical cord affecting management of mother Normal growth  5. Pica Hgb is 9.5--begin Iron - ferrous sulfate (FERROUSUL) 325 (65 FE) MG tablet; Take 1 tablet (325 mg total) by mouth every other day.  Dispense: 60 tablet; Refill: 1  6. Other iron deficiency anemia   Preterm labor symptoms and general obstetric precautions including but not limited to vaginal bleeding, contractions, leaking of fluid and fetal movement were reviewed in detail with the patient. Please refer to After Visit Summary for other counseling recommendations.   Return in 2 weeks (on 10/30/2020).  Future Appointments  Date Time Provider Department Center  10/23/2020  4:00 PM [facility] NURSE [facility]  10/28/2020  3:50 PM [place] Bing, MD [facility]  11/06/2020  8:15 AM [place] Bing, MD [facility]  11/10/2020  3:30 PM [facility] NURSE [facility]  11/10/2020  3:45 PM [facility] US1 [facility]  11/11/2020  3:50 PM [place] Bing, MD [facility]  11/17/2020  1:10 PM [place] Bing, MD [facility]    Reva Bores, MD

## 2020-10-20 ENCOUNTER — Ambulatory Visit (INDEPENDENT_AMBULATORY_CARE_PROVIDER_SITE_OTHER): Payer: Medicaid Other | Admitting: *Deleted

## 2020-10-20 ENCOUNTER — Other Ambulatory Visit: Payer: Self-pay

## 2020-10-20 VITALS — BP 142/87 | HR 92

## 2020-10-20 DIAGNOSIS — Z3A37 37 weeks gestation of pregnancy: Secondary | ICD-10-CM

## 2020-10-20 DIAGNOSIS — O139 Gestational [pregnancy-induced] hypertension without significant proteinuria, unspecified trimester: Secondary | ICD-10-CM

## 2020-10-20 DIAGNOSIS — O133 Gestational [pregnancy-induced] hypertension without significant proteinuria, third trimester: Secondary | ICD-10-CM | POA: Diagnosis not present

## 2020-10-20 LAB — CBC
Hematocrit: 28.7 % — ABNORMAL LOW (ref 34.0–46.6)
Hemoglobin: 9.3 g/dL — ABNORMAL LOW (ref 11.1–15.9)
MCH: 24 pg — ABNORMAL LOW (ref 26.6–33.0)
MCHC: 32.4 g/dL (ref 31.5–35.7)
MCV: 74 fL — ABNORMAL LOW (ref 79–97)
Platelets: 275 10*3/uL (ref 150–450)
RBC: 3.88 x10E6/uL (ref 3.77–5.28)
RDW: 14.6 % (ref 11.7–15.4)
WBC: 10.9 10*3/uL — ABNORMAL HIGH (ref 3.4–10.8)

## 2020-10-20 NOTE — Progress Notes (Addendum)
Subjective:  Stephanie Velazquez is a 20 y.o. female here for BP check.   Hypertension ROS: no chest pain on exertion, no dyspnea on exertion, and noting swelling of ankles.  Patient denies any headaches, visual symptoms, RUQ/epigastric pain or other concerning symptoms.   Objective:  BP (!) 142/87   Pulse 92   LMP  (LMP Unknown)   Appearance alert, well appearing, and in no distress. General exam BP noted to be elevated today in office.    Assessment:   Blood Pressure  elevated .   Plan:  Dr Macon Large made aware of BP in office, order NST and lab work. Pt now meets criteria for gestational hypertension and will need BPP/NST next week and IOL at 37 weeks. Pt made aware.  Marland Kitchen

## 2020-10-21 ENCOUNTER — Encounter: Payer: Self-pay | Admitting: Obstetrics & Gynecology

## 2020-10-21 ENCOUNTER — Telehealth: Payer: Self-pay | Admitting: *Deleted

## 2020-10-21 ENCOUNTER — Other Ambulatory Visit: Payer: Self-pay | Admitting: *Deleted

## 2020-10-21 DIAGNOSIS — O1493 Unspecified pre-eclampsia, third trimester: Secondary | ICD-10-CM

## 2020-10-21 LAB — PROTEIN / CREATININE RATIO, URINE
Creatinine, Urine: 328.6 mg/dL
Protein, Ur: 102.9 mg/dL
Protein/Creat Ratio: 313 mg/g creat — ABNORMAL HIGH (ref 0–200)

## 2020-10-21 LAB — COMPREHENSIVE METABOLIC PANEL
ALT: 8 IU/L (ref 0–32)
AST: 15 IU/L (ref 0–40)
Albumin/Globulin Ratio: 1.5 (ref 1.2–2.2)
Albumin: 3.7 g/dL — ABNORMAL LOW (ref 3.9–5.0)
Alkaline Phosphatase: 128 IU/L — ABNORMAL HIGH (ref 44–121)
BUN/Creatinine Ratio: 15 (ref 9–23)
BUN: 12 mg/dL (ref 6–20)
Bilirubin Total: <0.2 mg/dL (ref 0.0–1.2)
CO2: 18 mmol/L — ABNORMAL LOW (ref 20–29)
Calcium: 9.3 mg/dL (ref 8.7–10.2)
Chloride: 102 mmol/L (ref 96–106)
Creatinine, Ser: 0.81 mg/dL (ref 0.57–1.00)
Globulin, Total: 2.5 g/dL (ref 1.5–4.5)
Glucose: 88 mg/dL (ref 65–99)
Potassium: 5 mmol/L (ref 3.5–5.2)
Sodium: 138 mmol/L (ref 134–144)
Total Protein: 6.2 g/dL (ref 6.0–8.5)
eGFR: 106 mL/min/{1.73_m2} (ref >59)

## 2020-10-21 NOTE — Addendum Note (Signed)
Addended by: Jaynie Collins A on: 10/21/2020 08:14 AM   Modules accepted: Orders, SmartSet

## 2020-10-21 NOTE — Progress Notes (Signed)
Patient was assessed and managed by nursing staff during this encounter. I have reviewed the chart and agree with the documentation and plan, which was discussed with me.  L&D orders signed and held for IOL at 37 weeks due to [facility]. Antenatal testing scheduled for next week, will also get pelvic cultures at that point.    Jaynie Collins, MD 10/21/2020 8:10 AM

## 2020-10-21 NOTE — Telephone Encounter (Signed)
-----   Message from Tereso Newcomer, MD sent at 10/21/2020  3:12 PM EDT ----- Patient has preeclampsia without severe features.  Already scheduled for IOL at 37 weeks (11/03/2020), antenatal testing.  Last growth scan was on 09/29/20, EFW 99%.  Can we get a growth scan done this week or next week with BPP at [facility] or maybe sometime before delivery given this diagnosis?   Thank you!  Please call to inform patient of results and recommendations.

## 2020-10-21 NOTE — Telephone Encounter (Signed)
Pt informed of results and appt made for f/u growth and BPP this week.

## 2020-10-23 ENCOUNTER — Encounter: Payer: Self-pay | Admitting: *Deleted

## 2020-10-23 ENCOUNTER — Ambulatory Visit: Payer: Medicaid Other | Admitting: *Deleted

## 2020-10-23 ENCOUNTER — Other Ambulatory Visit: Payer: Self-pay

## 2020-10-23 ENCOUNTER — Ambulatory Visit: Payer: Medicaid Other | Attending: Obstetrics

## 2020-10-23 ENCOUNTER — Ambulatory Visit: Payer: Medicaid Other

## 2020-10-23 VITALS — BP 140/84 | HR 66

## 2020-10-23 DIAGNOSIS — O3663X0 Maternal care for excessive fetal growth, third trimester, not applicable or unspecified: Secondary | ICD-10-CM | POA: Diagnosis not present

## 2020-10-23 DIAGNOSIS — O1403 Mild to moderate pre-eclampsia, third trimester: Secondary | ICD-10-CM | POA: Diagnosis not present

## 2020-10-23 DIAGNOSIS — O1493 Unspecified pre-eclampsia, third trimester: Secondary | ICD-10-CM | POA: Insufficient documentation

## 2020-10-23 DIAGNOSIS — Z3403 Encounter for supervision of normal first pregnancy, third trimester: Secondary | ICD-10-CM | POA: Diagnosis present

## 2020-10-23 DIAGNOSIS — Z3A35 35 weeks gestation of pregnancy: Secondary | ICD-10-CM

## 2020-10-27 ENCOUNTER — Other Ambulatory Visit: Payer: Self-pay | Admitting: Advanced Practice Midwife

## 2020-10-28 ENCOUNTER — Encounter (HOSPITAL_COMMUNITY): Payer: Self-pay | Admitting: *Deleted

## 2020-10-28 ENCOUNTER — Encounter (HOSPITAL_COMMUNITY): Payer: Self-pay

## 2020-10-28 ENCOUNTER — Other Ambulatory Visit: Payer: Self-pay

## 2020-10-28 ENCOUNTER — Other Ambulatory Visit (HOSPITAL_COMMUNITY)
Admission: RE | Admit: 2020-10-28 | Discharge: 2020-10-28 | Disposition: A | Discharge status: Home / Self Care | Payer: Medicaid Other | Source: Ambulatory Visit | Attending: Obstetrics and Gynecology | Admitting: Obstetrics and Gynecology

## 2020-10-28 ENCOUNTER — Telehealth (HOSPITAL_COMMUNITY): Payer: Self-pay | Admitting: *Deleted

## 2020-10-28 ENCOUNTER — Ambulatory Visit (INDEPENDENT_AMBULATORY_CARE_PROVIDER_SITE_OTHER): Payer: Medicaid Other | Admitting: Obstetrics and Gynecology

## 2020-10-28 VITALS — BP 138/91 | HR 73 | Wt 185.8 lb

## 2020-10-28 DIAGNOSIS — R8271 Bacteriuria: Secondary | ICD-10-CM | POA: Insufficient documentation

## 2020-10-28 DIAGNOSIS — O43193 Other malformation of placenta, third trimester: Secondary | ICD-10-CM | POA: Insufficient documentation

## 2020-10-28 DIAGNOSIS — O1403 Mild to moderate pre-eclampsia, third trimester: Secondary | ICD-10-CM

## 2020-10-28 DIAGNOSIS — Z3A36 36 weeks gestation of pregnancy: Secondary | ICD-10-CM | POA: Insufficient documentation

## 2020-10-28 DIAGNOSIS — O43199 Other malformation of placenta, unspecified trimester: Secondary | ICD-10-CM

## 2020-10-28 DIAGNOSIS — O26893 Other specified pregnancy related conditions, third trimester: Secondary | ICD-10-CM | POA: Insufficient documentation

## 2020-10-28 MED ORDER — FAMOTIDINE 20 MG PO TABS: 20.0000 mg | ORAL_TABLET | Freq: Two times a day (BID) | ORAL | 1 refills | Status: DC

## 2020-10-28 NOTE — Telephone Encounter (Signed)
Preadmission screen  

## 2020-10-29 ENCOUNTER — Encounter: Payer: Self-pay | Admitting: Obstetrics and Gynecology

## 2020-10-29 DIAGNOSIS — O99013 Anemia complicating pregnancy, third trimester: Secondary | ICD-10-CM | POA: Insufficient documentation

## 2020-10-29 LAB — COMPREHENSIVE METABOLIC PANEL
ALT: 9 IU/L (ref 0–32)
AST: 15 IU/L (ref 0–40)
Albumin/Globulin Ratio: 1.3 (ref 1.2–2.2)
Albumin: 3.8 g/dL — ABNORMAL LOW (ref 3.9–5.0)
Alkaline Phosphatase: 138 IU/L — ABNORMAL HIGH (ref 44–121)
BUN/Creatinine Ratio: 11 (ref 9–23)
BUN: 8 mg/dL (ref 6–20)
Bilirubin Total: <0.2 mg/dL (ref 0.0–1.2)
CO2: 20 mmol/L (ref 20–29)
Calcium: 9 mg/dL (ref 8.7–10.2)
Chloride: 105 mmol/L (ref 96–106)
Creatinine, Ser: 0.76 mg/dL (ref 0.57–1.00)
Globulin, Total: 2.9 g/dL (ref 1.5–4.5)
Glucose: 84 mg/dL (ref 65–99)
Potassium: 4.7 mmol/L (ref 3.5–5.2)
Sodium: 138 mmol/L (ref 134–144)
Total Protein: 6.7 g/dL (ref 6.0–8.5)
eGFR: 114 mL/min/{1.73_m2} (ref >59)

## 2020-10-29 LAB — CBC
Hematocrit: 29.3 % — ABNORMAL LOW (ref 34.0–46.6)
Hemoglobin: 9 g/dL — ABNORMAL LOW (ref 11.1–15.9)
MCH: 22.5 pg — ABNORMAL LOW (ref 26.6–33.0)
MCHC: 30.7 g/dL — ABNORMAL LOW (ref 31.5–35.7)
MCV: 73 fL — ABNORMAL LOW (ref 79–97)
Platelets: 240 10*3/uL (ref 150–450)
RBC: 4 x10E6/uL (ref 3.77–5.28)
RDW: 15 % (ref 11.7–15.4)
WBC: 11.1 10*3/uL — ABNORMAL HIGH (ref 3.4–10.8)

## 2020-10-29 NOTE — Progress Notes (Signed)
Pt informed that the ultrasound is considered a limited OB ultrasound and is not intended to be a complete ultrasound exam.  Patient also informed that the ultrasound is not being completed with the intent of assessing for fetal or placental anomalies or any pelvic abnormalities.  Explained that the purpose of today's ultrasound is to assess for  BPP, presentation, and AFI.  Patient acknowledges the purpose of the exam and the limitations of the study.     Chase Caller RN BSN 10/30/20  Patient has multiple elevated BP in office and initially reported spots in her vision at arrival. When visit was completed patient stated the spots in her vision were gone and denied a headache. Per Dr Vergie Living, patient is okay to go home. Reviewed concerning signs & symptoms of pre-e with patient and when to present to MAU. Patient verbalized understanding.

## 2020-10-30 ENCOUNTER — Ambulatory Visit (INDEPENDENT_AMBULATORY_CARE_PROVIDER_SITE_OTHER): Payer: Medicaid Other | Admitting: General Practice

## 2020-10-30 ENCOUNTER — Ambulatory Visit (INDEPENDENT_AMBULATORY_CARE_PROVIDER_SITE_OTHER): Payer: Medicaid Other

## 2020-10-30 ENCOUNTER — Other Ambulatory Visit: Payer: Self-pay

## 2020-10-30 VITALS — BP 151/92 | HR 63 | Wt 186.4 lb

## 2020-10-30 DIAGNOSIS — O1403 Mild to moderate pre-eclampsia, third trimester: Secondary | ICD-10-CM

## 2020-10-30 DIAGNOSIS — O0990 Supervision of high risk pregnancy, unspecified, unspecified trimester: Secondary | ICD-10-CM

## 2020-10-30 LAB — GC/CHLAMYDIA PROBE AMP (~~LOC~~) NOT AT ARMC
Chlamydia: NEGATIVE
Comment: NEGATIVE
Comment: NORMAL
Neisseria Gonorrhea: NEGATIVE

## 2020-10-30 NOTE — Progress Notes (Signed)
Pt reports spots in vision today and denies headache

## 2020-10-30 NOTE — Progress Notes (Signed)
   PRENATAL VISIT NOTE  Subjective:  Stephanie Velazquez is a 20 y.o. G1P0000 at [redacted]w[redacted]d being seen today for ongoing prenatal care.  She is currently monitored for the following issues for this high-risk pregnancy and has Supervision of high risk pregnancy, antepartum; UTI in pregnancy; Pyelonephritis affecting pregnancy; GBS bacteriuria; Marginal insertion of umbilical cord affecting management of mother; Mild pre-eclampsia; and Anemia in pregnancy, third trimester on their problem list.  Patient reports no complaints.  Contractions: Irritability. Vag. Bleeding: None.  Movement: Present. Denies leaking of fluid.   The following portions of the patient's history were reviewed and updated as appropriate: allergies, current medications, past family history, past medical history, past social history, past surgical history and problem list.   Objective:   Vitals:   10/28/20 1545  BP: (!) 138/91  Pulse: 73  Weight: 185 lb 12.8 oz (84.3 kg)    Fetal Status: Fetal Heart Rate (bpm): 161   Movement: Present     General:  Alert, oriented and cooperative. Patient is in no acute distress.  Skin: Skin is warm and dry. No rash noted.   Cardiovascular: Normal heart rate noted  Respiratory: Normal respiratory effort, no problems with respiration noted  Abdomen: Soft, gravid, appropriate for gestational age.  Pain/Pressure: Present     Pelvic: Cervical exam deferred        Extremities: Normal range of motion.  Edema: Moderate pitting, indentation subsides rapidly  Mental Status: Normal mood and affect. Normal behavior. Normal judgment and thought content.   Assessment and Plan:  Pregnancy: G1P0000 at [redacted]w[redacted]d 1. [redacted] weeks gestation of pregnancy - GC/Chlamydia probe amp ([place])not at [facility]  2. Marginal insertion of umbilical cord affecting management of mother 9/1: afi 17, bpp 8/8, efw 76%, 2933g  3. GBS bacteriuria Tx in labor  4. Mild pre-eclampsia in third trimester No s/s. Precautions given.  Surveillance labs today. Pt for IOL on 9/12 - CBC - Comprehensive metabolic panel  Preterm labor symptoms and general obstetric precautions including but not limited to vaginal bleeding, contractions, leaking of fluid and fetal movement were reviewed in detail with the patient. Please refer to After Visit Summary for other counseling recommendations.   Return in about 15 days (around 11/12/2020) for in person, rn visit, bp check.  Future Appointments  Date Time Provider Department Center  10/30/2020 11:15 AM [facility] NST [facility]  11/03/2020  6:45 AM [facility] SCHED ROOM [facility] None  11/11/2020  8:30 AM [facility] NURSE [facility]  12/09/2020 11:15 AM Anyanwu, Jethro Bastos, MD [facility]    [place] Bing, MD

## 2020-10-31 ENCOUNTER — Other Ambulatory Visit: Payer: Self-pay | Admitting: Obstetrics & Gynecology

## 2020-11-01 LAB — SARS CORONAVIRUS 2 (TAT 6-24 HRS): SARS Coronavirus 2: NEGATIVE

## 2020-11-03 ENCOUNTER — Inpatient Hospital Stay (HOSPITAL_COMMUNITY): Payer: Medicaid Other

## 2020-11-03 ENCOUNTER — Inpatient Hospital Stay (HOSPITAL_COMMUNITY)
Admission: AD | Admit: 2020-11-03 | Discharge: 2020-11-06 | DRG: 806 | Disposition: A | Discharge status: Home / Self Care | Payer: Medicaid Other | Attending: Obstetrics and Gynecology | Admitting: Obstetrics and Gynecology

## 2020-11-03 ENCOUNTER — Inpatient Hospital Stay (HOSPITAL_COMMUNITY): Payer: Medicaid Other | Admitting: Anesthesiology

## 2020-11-03 ENCOUNTER — Inpatient Hospital Stay (HOSPITAL_COMMUNITY)
Admission: AD | Admit: 2020-11-03 | Payer: Medicaid Other | Source: Home / Self Care | Admitting: Obstetrics & Gynecology

## 2020-11-03 ENCOUNTER — Encounter (HOSPITAL_COMMUNITY): Payer: Self-pay | Admitting: Obstetrics & Gynecology

## 2020-11-03 ENCOUNTER — Other Ambulatory Visit: Payer: Self-pay

## 2020-11-03 DIAGNOSIS — O0990 Supervision of high risk pregnancy, unspecified, unspecified trimester: Secondary | ICD-10-CM

## 2020-11-03 DIAGNOSIS — O1400 Mild to moderate pre-eclampsia, unspecified trimester: Secondary | ICD-10-CM | POA: Diagnosis present

## 2020-11-03 DIAGNOSIS — O1404 Mild to moderate pre-eclampsia, complicating childbirth: Secondary | ICD-10-CM | POA: Diagnosis present

## 2020-11-03 DIAGNOSIS — Z3A37 37 weeks gestation of pregnancy: Secondary | ICD-10-CM

## 2020-11-03 DIAGNOSIS — O43199 Other malformation of placenta, unspecified trimester: Secondary | ICD-10-CM | POA: Diagnosis present

## 2020-11-03 DIAGNOSIS — O99013 Anemia complicating pregnancy, third trimester: Secondary | ICD-10-CM | POA: Diagnosis present

## 2020-11-03 DIAGNOSIS — O43123 Velamentous insertion of umbilical cord, third trimester: Secondary | ICD-10-CM | POA: Diagnosis present

## 2020-11-03 DIAGNOSIS — D62 Acute posthemorrhagic anemia: Secondary | ICD-10-CM | POA: Diagnosis not present

## 2020-11-03 DIAGNOSIS — O1403 Mild to moderate pre-eclampsia, third trimester: Secondary | ICD-10-CM

## 2020-11-03 DIAGNOSIS — O9081 Anemia of the puerperium: Secondary | ICD-10-CM | POA: Diagnosis not present

## 2020-11-03 DIAGNOSIS — R8271 Bacteriuria: Secondary | ICD-10-CM | POA: Diagnosis present

## 2020-11-03 DIAGNOSIS — O134 Gestational [pregnancy-induced] hypertension without significant proteinuria, complicating childbirth: Secondary | ICD-10-CM | POA: Diagnosis not present

## 2020-11-03 DIAGNOSIS — O133 Gestational [pregnancy-induced] hypertension without significant proteinuria, third trimester: Secondary | ICD-10-CM | POA: Diagnosis present

## 2020-11-03 DIAGNOSIS — O99824 Streptococcus B carrier state complicating childbirth: Secondary | ICD-10-CM | POA: Diagnosis present

## 2020-11-03 LAB — COMPREHENSIVE METABOLIC PANEL
ALT: 10 U/L (ref 0–44)
ALT: 11 U/L (ref 0–44)
AST: 22 U/L (ref 15–41)
AST: 19 U/L (ref 15–41)
Albumin: 2.8 g/dL — ABNORMAL LOW (ref 3.5–5.0)
Albumin: 2.6 g/dL — ABNORMAL LOW (ref 3.5–5.0)
Alkaline Phosphatase: 114 U/L (ref 38–126)
Alkaline Phosphatase: 112 U/L (ref 38–126)
Anion gap: 10 (ref 5–15)
Anion gap: 9 (ref 5–15)
BUN: 9 mg/dL (ref 6–20)
BUN: 7 mg/dL (ref 6–20)
CO2: 18 mmol/L — ABNORMAL LOW (ref 22–32)
CO2: 20 mmol/L — ABNORMAL LOW (ref 22–32)
Calcium: 9 mg/dL (ref 8.9–10.3)
Calcium: 8.5 mg/dL — ABNORMAL LOW (ref 8.9–10.3)
Chloride: 107 mmol/L (ref 98–111)
Chloride: 108 mmol/L (ref 98–111)
Creatinine, Ser: 0.98 mg/dL (ref 0.44–1.00)
Creatinine, Ser: 0.84 mg/dL (ref 0.44–1.00)
GFR, Estimated: >60 mL/min (ref >60)
GFR, Estimated: >60 mL/min (ref >60)
Glucose, Bld: 95 mg/dL (ref 70–99)
Glucose, Bld: 102 mg/dL — ABNORMAL HIGH (ref 70–99)
Potassium: 3.7 mmol/L (ref 3.5–5.1)
Potassium: 4.2 mmol/L (ref 3.5–5.1)
Sodium: 135 mmol/L (ref 135–145)
Sodium: 137 mmol/L (ref 135–145)
Total Bilirubin: 0.5 mg/dL (ref 0.3–1.2)
Total Bilirubin: 0.4 mg/dL (ref 0.3–1.2)
Total Protein: 6.3 g/dL — ABNORMAL LOW (ref 6.5–8.1)
Total Protein: 5.7 g/dL — ABNORMAL LOW (ref 6.5–8.1)

## 2020-11-03 LAB — CBC WITH DIFFERENTIAL/PLATELET
Abs Immature Granulocytes: 0.08 10*3/uL — ABNORMAL HIGH (ref 0.00–0.07)
Basophils Absolute: 0.1 10*3/uL (ref 0.0–0.1)
Basophils Relative: 0 %
Eosinophils Absolute: 0 10*3/uL (ref 0.0–0.5)
Eosinophils Relative: 0 %
HCT: 25.9 % — ABNORMAL LOW (ref 36.0–46.0)
Hemoglobin: 8.1 g/dL — ABNORMAL LOW (ref 12.0–15.0)
Immature Granulocytes: 1 %
Lymphocytes Relative: 17 %
Lymphs Abs: 2.4 10*3/uL (ref 0.7–4.0)
MCH: 23.2 pg — ABNORMAL LOW (ref 26.0–34.0)
MCHC: 31.3 g/dL (ref 30.0–36.0)
MCV: 74.2 fL — ABNORMAL LOW (ref 80.0–100.0)
Monocytes Absolute: 0.9 10*3/uL (ref 0.1–1.0)
Monocytes Relative: 6 %
Neutro Abs: 10.9 10*3/uL — ABNORMAL HIGH (ref 1.7–7.7)
Neutrophils Relative %: 76 %
Platelets: 205 10*3/uL (ref 150–400)
RBC: 3.49 MIL/uL — ABNORMAL LOW (ref 3.87–5.11)
RDW: 16 % — ABNORMAL HIGH (ref 11.5–15.5)
WBC: 14.4 10*3/uL — ABNORMAL HIGH (ref 4.0–10.5)
nRBC: 0.1 % (ref 0.0–0.2)

## 2020-11-03 LAB — PROTEIN / CREATININE RATIO, URINE
Creatinine, Urine: 110.07 mg/dL
Protein Creatinine Ratio: 2.1 mg/mg{Cre} — ABNORMAL HIGH (ref 0.00–0.15)
Total Protein, Urine: 231 mg/dL

## 2020-11-03 LAB — CBC
HCT: 28.2 % — ABNORMAL LOW (ref 36.0–46.0)
Hemoglobin: 8.7 g/dL — ABNORMAL LOW (ref 12.0–15.0)
MCH: 23 pg — ABNORMAL LOW (ref 26.0–34.0)
MCHC: 30.9 g/dL (ref 30.0–36.0)
MCV: 74.6 fL — ABNORMAL LOW (ref 80.0–100.0)
Platelets: 222 10*3/uL (ref 150–400)
RBC: 3.78 MIL/uL — ABNORMAL LOW (ref 3.87–5.11)
RDW: 15.8 % — ABNORMAL HIGH (ref 11.5–15.5)
WBC: 9.4 10*3/uL (ref 4.0–10.5)
nRBC: 0.2 % (ref 0.0–0.2)

## 2020-11-03 MED ORDER — TERBUTALINE SULFATE 1 MG/ML IJ SOLN: 0.2500 mg | Freq: Once | INTRAMUSCULAR | Status: DC | PRN

## 2020-11-03 MED ORDER — OXYTOCIN-SODIUM CHLORIDE 30-0.9 UT/500ML-% IV SOLN
1.0000 m[IU]/min | INTRAVENOUS | Status: DC
  Administered 2020-11-04: 2 m[IU]/min via INTRAVENOUS
  Filled 2020-11-03 (×2): qty 500

## 2020-11-03 MED ORDER — OXYTOCIN BOLUS FROM INFUSION
333.0000 mL | Freq: Once | INTRAVENOUS | Status: AC
  Administered 2020-11-04: 333 mL via INTRAVENOUS

## 2020-11-03 MED ORDER — PENICILLIN G POT IN DEXTROSE 60000 UNIT/ML IV SOLN
3.0000 10*6.[IU] | INTRAVENOUS | Status: DC
  Administered 2020-11-03 – 2020-11-04 (×6): 3 10*6.[IU] via INTRAVENOUS
  Filled 2020-11-03 (×6): qty 50

## 2020-11-03 MED ORDER — LIDOCAINE HCL (PF) 1 % IJ SOLN
30.0000 mL | INTRAMUSCULAR | Status: AC | PRN
  Administered 2020-11-04: 30 mL via SUBCUTANEOUS
  Filled 2020-11-03: qty 30

## 2020-11-03 MED ORDER — EPHEDRINE 5 MG/ML INJ: 10.0000 mg | INTRAVENOUS | Status: DC | PRN

## 2020-11-03 MED ORDER — HYDROXYZINE HCL 50 MG PO TABS: 50.0000 mg | ORAL_TABLET | Freq: Four times a day (QID) | ORAL | Status: DC | PRN

## 2020-11-03 MED ORDER — FENTANYL-BUPIVACAINE-NACL 0.5-0.125-0.9 MG/250ML-% EP SOLN
12.0000 mL/h | EPIDURAL | Status: DC | PRN
  Administered 2020-11-03: 12 mL/h via EPIDURAL
  Filled 2020-11-03: qty 250

## 2020-11-03 MED ORDER — PHENYLEPHRINE 40 MCG/ML (10ML) SYRINGE FOR IV PUSH (FOR BLOOD PRESSURE SUPPORT)
80.0000 ug | PREFILLED_SYRINGE | INTRAVENOUS | Status: DC | PRN
Start: 2020-11-03 — End: 2020-11-04

## 2020-11-03 MED ORDER — LABETALOL HCL 200 MG PO TABS
200.0000 mg | ORAL_TABLET | Freq: Two times a day (BID) | ORAL | Status: DC
  Administered 2020-11-03 – 2020-11-04 (×3): 200 mg via ORAL
  Filled 2020-11-03 (×3): qty 1

## 2020-11-03 MED ORDER — MISOPROSTOL 25 MCG QUARTER TABLET: 25.0000 ug | ORAL_TABLET | ORAL | Status: DC | PRN

## 2020-11-03 MED ORDER — FENTANYL CITRATE (PF) 100 MCG/2ML IJ SOLN
50.0000 ug | INTRAMUSCULAR | Status: DC | PRN
  Administered 2020-11-03: 100 ug via INTRAVENOUS
  Administered 2020-11-03: 50 ug via INTRAVENOUS
  Filled 2020-11-03 (×2): qty 2

## 2020-11-03 MED ORDER — LACTATED RINGERS IV SOLN: 500.0000 mL | INTRAVENOUS | Status: DC | PRN

## 2020-11-03 MED ORDER — LACTATED RINGERS IV SOLN
500.0000 mL | Freq: Once | INTRAVENOUS | Status: AC
  Administered 2020-11-03: 500 mL via INTRAVENOUS

## 2020-11-03 MED ORDER — OXYCODONE-ACETAMINOPHEN 5-325 MG PO TABS: 1.0000 | ORAL_TABLET | ORAL | Status: DC | PRN

## 2020-11-03 MED ORDER — ONDANSETRON HCL 4 MG/2ML IJ SOLN: 4.0000 mg | Freq: Four times a day (QID) | INTRAMUSCULAR | Status: DC | PRN

## 2020-11-03 MED ORDER — LACTATED RINGERS IV SOLN: INTRAVENOUS | Status: DC

## 2020-11-03 MED ORDER — ACETAMINOPHEN 325 MG PO TABS: 650.0000 mg | ORAL_TABLET | ORAL | Status: DC | PRN

## 2020-11-03 MED ORDER — OXYCODONE-ACETAMINOPHEN 5-325 MG PO TABS: 2.0000 | ORAL_TABLET | ORAL | Status: DC | PRN

## 2020-11-03 MED ORDER — DIPHENHYDRAMINE HCL 50 MG/ML IJ SOLN: 12.5000 mg | INTRAMUSCULAR | Status: DC | PRN

## 2020-11-03 MED ORDER — LIDOCAINE-EPINEPHRINE (PF) 2 %-1:200000 IJ SOLN
INTRAMUSCULAR | Status: DC | PRN
  Administered 2020-11-03: 5 mL via EPIDURAL

## 2020-11-03 MED ORDER — OXYTOCIN-SODIUM CHLORIDE 30-0.9 UT/500ML-% IV SOLN
1.0000 m[IU]/min | INTRAVENOUS | Status: DC
Start: 2020-11-03 — End: 2020-11-04

## 2020-11-03 MED ORDER — MISOPROSTOL 50MCG HALF TABLET
50.0000 ug | ORAL_TABLET | ORAL | Status: DC | PRN
  Administered 2020-11-03 (×3): 50 ug via ORAL
  Filled 2020-11-03 (×3): qty 1

## 2020-11-03 MED ORDER — SODIUM CHLORIDE 0.9 % IV SOLN
5.0000 10*6.[IU] | Freq: Once | INTRAVENOUS | Status: AC
  Administered 2020-11-03: 5 10*6.[IU] via INTRAVENOUS
  Filled 2020-11-03: qty 5

## 2020-11-03 MED ORDER — OXYTOCIN-SODIUM CHLORIDE 30-0.9 UT/500ML-% IV SOLN: 2.5000 [IU]/h | INTRAVENOUS | Status: DC

## 2020-11-03 MED ORDER — SOD CITRATE-CITRIC ACID 500-334 MG/5ML PO SOLN: 30.0000 mL | ORAL | Status: DC | PRN

## 2020-11-03 MED ORDER — PHENYLEPHRINE 40 MCG/ML (10ML) SYRINGE FOR IV PUSH (FOR BLOOD PRESSURE SUPPORT): 80.0000 ug | PREFILLED_SYRINGE | INTRAVENOUS | Status: DC | PRN

## 2020-11-03 MED ORDER — ZOLPIDEM TARTRATE 5 MG PO TABS: 5.0000 mg | ORAL_TABLET | Freq: Every evening | ORAL | Status: DC | PRN

## 2020-11-03 MED ORDER — FLEET ENEMA 7-19 GM/118ML RE ENEM: 1.0000 | ENEMA | Freq: Every day | RECTAL | Status: DC | PRN

## 2020-11-03 NOTE — H&P (Signed)
OBSTETRIC ADMISSION HISTORY AND PHYSICAL  Stephanie Velazquez is a 20 y.o. female G1P0000 with IUP at [redacted]w[redacted]d by LMP presenting for IOL for pre-eclampsia(p:c 313 on 8/29). She reports +FMs, No LOF, no VB, no blurry vision, headaches or peripheral edema, and RUQ pain.  She plans on breast feeding. She request post placental liletta for birth control. She received her prenatal care at  [facility]    Dating: By Early [place] --->  Estimated Date of Delivery: 11/24/20  Sono:    @[redacted]w[redacted]d , CWD, normal anatomy, cephalic presentation, anterior placental lie, 2933g, 76%ile EFW   Prenatal History/Complications:  Pre-eclampsia -elevated BP and urine p:c 313 on 8/29 GBS bacteuria Anemia in pregnancy: HgB 9.0 (10/28/2020)  Past Medical History: Past Medical History:  Diagnosis Date   Anxiety    Depression    Just started back on meds   Headache    Infection    UTI   Pregnancy induced hypertension     Past Surgical History: Past Surgical History:  Procedure Laterality Date   WISDOM TOOTH EXTRACTION  04/02/2016    Obstetrical History: OB History     Gravida  1   Para  0   Term  0   Preterm  0   AB  0   Living  0      SAB  0   IAB  0   Ectopic  0   Multiple  0   Live Births  0           Social History Social History   Socioeconomic History   Marital status: Single    Spouse name: Not on file   Number of children: Not on file   Years of education: Not on file   Highest education level: Not on file  Occupational History   Not on file  Tobacco Use   Smoking status: Never   Smokeless tobacco: Never  Vaping Use   Vaping Use: Never used  Substance and Sexual Activity   Alcohol use: No   Drug use: No   Sexual activity: Yes    Partners: Male    Birth control/protection: Pill  Other Topics Concern   Not on file  Social History Narrative   Not on file   Social Determinants of Health   Financial Resource Strain: Not on file  Food Insecurity: Not on file   Transportation Needs: Not on file  Physical Activity: Not on file  Stress: Not on file  Social Connections: Not on file    Family History: Family History  Problem Relation Age of Onset   Healthy Father    Healthy Mother     Allergies: Allergies  Allergen Reactions   Flagyl [Metronidazole] Hives   Monistat [Miconazole] Hives    Burning inside    Medications Prior to Admission  Medication Sig Dispense Refill Last Dose   famotidine (PEPCID) 20 MG tablet Take 1 tablet (20 mg total) by mouth 2 (two) times daily. (Patient not taking: Reported on 10/30/2020) 30 tablet 1    ferrous sulfate (FERROUSUL) 325 (65 FE) MG tablet Take 1 tablet (325 mg total) by mouth every other day. 60 tablet 1    Prenatal Vit-Fe Fumarate-FA (MULTIVITAMIN-PRENATAL) 27-0.8 MG TABS tablet Take 1 tablet by mouth daily at 12 noon.        Review of Systems   All systems reviewed and negative except as stated in HPI  Blood pressure (!) 148/84, pulse 67, temperature (!) 97.5 F (36.4 C), temperature source  Oral, resp. rate 17, height 5\' 8"  (1.727 m), weight 84.2 kg. General appearance: alert Lungs: clear to auscultation bilaterally Heart: regular rate and rhythm Abdomen: soft, non-tender; bowel sounds normal Extremities: Homans sign is negative, no sign of DVT Presentation: cephalic Fetal monitoringBaseline: 130 bpm, Variability: Good {> 6 bpm), Accelerations: Reactive, and Decelerations: Absent Uterine activity irregular contractions Dilation: Closed Effacement (%): Thick Station: -3   Prenatal labs: ABO, Rh: --/--/AB POS (09/12 0841) Antibody: NEG (09/12 0841) Rubella: 1.85 (03/16 1505) RPR: Non Reactive (07/07 0842)  HBsAg: Negative (03/16 1505)  HIV: Non Reactive (07/07 0842)  GBS:   GBS bacteuria (08/28/2020) 2 hr Glucola Normal Genetic screening  negative Anatomy 10/29/2020 normal anatomy  Prenatal Transfer Tool  Maternal Diabetes: No Genetic Screening: Normal Maternal Ultrasounds/Referrals:  Other:Marginal US Fetal Ultrasounds or other Referrals:  None Maternal Substance Abuse:  No Significant Maternal Medications:  None Significant Maternal Lab Results: Group B Strep positive (bacteuria)  Results for orders placed or performed during the hospital encounter of 11/03/20 (from the past 24 hour(s))  CBC   Collection Time: 11/03/20  8:41 AM  Result Value Ref Range   WBC 9.4 4.0 - 10.5 K/uL   RBC 3.78 (L) 3.87 - 5.11 MIL/uL   Hemoglobin 8.7 (L) 12.0 - 15.0 g/dL   HCT 01/03/21 (L) 57.0 - 17.7 %   MCV 74.6 (L) 80.0 - 100.0 fL   MCH 23.0 (L) 26.0 - 34.0 pg   MCHC 30.9 30.0 - 36.0 g/dL   RDW 93.9 (H) 03.0 - 09.2 %   Platelets 222 150 - 400 K/uL   nRBC 0.2 0.0 - 0.2 %  Comprehensive metabolic panel   Collection Time: 11/03/20  8:41 AM  Result Value Ref Range   Sodium 135 135 - 145 mmol/L   Potassium 3.7 3.5 - 5.1 mmol/L   Chloride 107 98 - 111 mmol/L   CO2 18 (L) 22 - 32 mmol/L   Glucose, Bld 95 70 - 99 mg/dL   BUN 9 6 - 20 mg/dL   Creatinine, Ser 01/03/21 0.44 - 1.00 mg/dL   Calcium 9.0 8.9 - 0.76 mg/dL   Total Protein 6.3 (L) 6.5 - 8.1 g/dL   Albumin 2.8 (L) 3.5 - 5.0 g/dL   AST 22 15 - 41 U/L   ALT 10 0 - 44 U/L   Alkaline Phosphatase 114 38 - 126 U/L   Total Bilirubin 0.5 0.3 - 1.2 mg/dL   GFR, Estimated 22.6 >33 mL/min   Anion gap 10 5 - 15  Type and screen   Collection Time: 11/03/20  8:41 AM  Result Value Ref Range   ABO/RH(D) AB POS    Antibody Screen NEG    Sample Expiration      11/06/2020,2359 Performed at [facility], [address] [place], [place]     Patient Active Problem List   Diagnosis Date Noted   Gestational hypertension, third trimester 11/03/2020   Anemia in pregnancy, third trimester 10/29/2020   Mild pre-eclampsia 10/03/2020   Marginal insertion of umbilical cord affecting management of mother 08/28/2020   GBS bacteriuria 06/18/2020   Pyelonephritis affecting pregnancy 06/16/2020   UTI in pregnancy 05/07/2020    Supervision of high risk pregnancy, antepartum 04/08/2020    Assessment/Plan:  Stephanie Velazquez is a 20 y.o. G1P0000 at [redacted]w[redacted]d here for IOL for pre-eclampsia  #Labor:cervix closed, will start with buccal cytotec. Will assess for balloon placement #Pain: Planning epidural #FWB: Cat I #ID:  GBS in  urine > PCN #MOF: breast #MOC:PP Liletta IUD #Circ:  N/A  #Pre-Eclampsia p:c on 8/29 313, BP at admission elevated at 150s/90s. Denies neurological symptoms.  - Start PO Labetalol 200mg  BID - CMET and p:c and CBC at admission - will continue to monitor blood pressures and for symptoms of severe pre-eclampsia  , MD  11/03/2020, 12:11 PM

## 2020-11-03 NOTE — Progress Notes (Signed)
Labor Progress Note Stephanie Velazquez is a 20 y.o. G1P0000 at [redacted]w[redacted]d presented for IOL due to pre-e without severe features.   Paged by RN, FB fell out. Spoke with patient earlier this evening, doing well at that time s/p IV fent due to cramping with FB in place. Still anxious about labor experience but feeling hopeful.   O:  BP (!) 154/91   Pulse 70   Temp 98.3 F (36.8 C) (Oral)   Resp 16   Ht 5\' 8"  (1.727 m)   Wt 84.2 kg   LMP  (LMP Unknown)   BMI 28.24 kg/m  EFM: 130/mod/None   CVE: Dilation: 3 Effacement (%): 70 Station: -3 Presentation: Vertex Exam by:: [email or web address], RN   A&P: 20 y.o. G1P0000 [redacted]w[redacted]d  #Labor: FB out and s/p cytotec x3. Appropriate check per RN to start pit, ordered 2x2. Patient considering getting epidural first, gave option to see if cramping calms after FB now out vs getting now.  #Pain: Planning on epidural  #FWB: Cat 1  #GBS positive PCN  #Pre-e without severe features: SBP 140-150s. Pre-e labs WNL (exception of qualifier of >PC Ratio), repeating currently for epidural placement. Cont labetalol scheduled. Monitor closely.   [redacted]w[redacted]d, DO 10:02 PM

## 2020-11-03 NOTE — Anesthesia Preprocedure Evaluation (Signed)
Anesthesia Evaluation  Patient identified by MRN, date of birth, ID band Patient awake    Reviewed: Allergy & Precautions, NPO status , Patient's Chart, lab work & pertinent test results, reviewed documented beta blocker date and time   Airway Mallampati: II  TM Distance: >3 FB Neck ROM: Full    Dental no notable dental hx.    Pulmonary neg pulmonary ROS,    Pulmonary exam normal breath sounds clear to auscultation       Cardiovascular hypertension (preE), Pt. on medications and Pt. on home beta blockers negative cardio ROS Normal cardiovascular exam Rhythm:Regular Rate:Normal     Neuro/Psych  Headaches, PSYCHIATRIC DISORDERS Anxiety Depression    GI/Hepatic negative GI ROS, Neg liver ROS,   Endo/Other  negative endocrine ROS  Renal/GU negative Renal ROS  negative genitourinary   Musculoskeletal negative musculoskeletal ROS (+)   Abdominal   Peds  Hematology  (+) Blood dyscrasia (Hgb 8.1), anemia ,   Anesthesia Other Findings IOL for preE  Reproductive/Obstetrics (+) Pregnancy                             Anesthesia Physical Anesthesia Plan  ASA: 3  Anesthesia Plan: Epidural   Post-op Pain Management:    Induction:   PONV Risk Score and Plan: Treatment may vary due to age or medical condition  Airway Management Planned: Natural Airway  Additional Equipment:   Intra-op Plan:   Post-operative Plan:   Informed Consent: I have reviewed the patients History and Physical, chart, labs and discussed the procedure including the risks, benefits and alternatives for the proposed anesthesia with the patient or authorized representative who has indicated his/her understanding and acceptance.       Plan Discussed with: Anesthesiologist  Anesthesia Plan Comments: (Patient identified. Risks, benefits, options discussed with patient including but not limited to bleeding, infection, nerve  damage, paralysis, failed block, incomplete pain control, headache, blood pressure changes, nausea, vomiting, reactions to medication, itching, and post partum back pain. Confirmed with bedside nurse the patient's most recent platelet count. Confirmed with the patient that they are not taking any anticoagulation, have any bleeding history or any family history of bleeding disorders. Patient expressed understanding and wishes to proceed. All questions were answered. )        Anesthesia Quick Evaluation

## 2020-11-03 NOTE — Progress Notes (Signed)
Stephanie Velazquez is a 20 y.o. G1P0000 at [redacted]w[redacted]d admitted for induction of labor due to pre-eclampsia.  Subjective: Feels contractions some more. Not that frequent  Objective: BP 134/86   Pulse 66   Temp 98 F (36.7 C) (Oral)   Resp 19   Ht 5\' 8"  (1.727 m)   Wt 84.2 kg   LMP  (LMP Unknown)   BMI 28.24 kg/m  No intake/output data recorded. No intake/output data recorded.  FHT:  FHR: 130s bpm, variability: moderate,  accelerations:  Present,  decelerations:  Absent UC:   irregular SVE:   Dilation: Fingertip (internal os closed) Effacement (%): Thick Station: -3 Exam by:: Dr [email or web address]  Labs: Lab Results  Component Value Date   WBC 9.4 11/03/2020   HGB 8.7 (L) 11/03/2020   HCT 28.2 (L) 11/03/2020   MCV 74.6 (L) 11/03/2020   PLT 222 11/03/2020    Assessment / Plan: Induction of labor due to pre-eclampsia   Labor:  fingertip on external os, internal os closed, thick, and high; cytotec#2 give, will assess for balloon placement at next check Preeclampsia:   On PO Labetalol 200mg  BID, BP initially in 150s/90s, improved to 140s-130s/60s-80s. Will continue to monitor BP and for neurological signs and symptoms. P:c at admission below reportable range.   Fetal Wellbeing:  Category I Pain Control:   Plans epidural I/D:   GBS positive urine, PCN Anticipated MOD:  NSVD  01/03/2021 11/03/2020, 4:55 PM

## 2020-11-03 NOTE — Anesthesia Procedure Notes (Signed)
Epidural Patient location during procedure: OB Start time: 11/03/2020 11:05 PM End time: 11/03/2020 11:15 PM  Staffing Anesthesiologist: Elmer Picker, MD Performed: anesthesiologist   Preanesthetic Checklist Completed: patient identified, IV checked, risks and benefits discussed, monitors and equipment checked, pre-op evaluation and timeout performed  Epidural Patient position: sitting Prep: DuraPrep and site prepped and draped Patient monitoring: continuous pulse ox, blood pressure, heart rate and cardiac monitor Approach: midline Location: L3-L4 Injection technique: LOR air  Needle:  Needle type: Tuohy  Needle gauge: 17 G Needle length: 9 cm Needle insertion depth: 5 cm Catheter type: closed end flexible Catheter size: 19 Gauge Catheter at skin depth: 10 cm Test dose: negative  Assessment Sensory level: T8 Events: blood not aspirated, injection not painful, no injection resistance, no paresthesia and negative IV test  Additional Notes Patient identified. Risks/Benefits/Options discussed with patient including but not limited to bleeding, infection, nerve damage, paralysis, failed block, incomplete pain control, headache, blood pressure changes, nausea, vomiting, reactions to medication both or allergic, itching and postpartum back pain. Confirmed with bedside nurse the patient's most recent platelet count. Confirmed with patient that they are not currently taking any anticoagulation, have any bleeding history or any family history of bleeding disorders. Patient expressed understanding and wished to proceed. All questions were answered. Sterile technique was used throughout the entire procedure. Please see nursing notes for vital signs. Test dose was given through epidural catheter and negative prior to continuing to dose epidural or start infusion. Warning signs of high block given to the patient including shortness of breath, tingling/numbness in hands, complete motor block,  or any concerning symptoms with instructions to call for help. Patient was given instructions on fall risk and not to get out of bed. All questions and concerns addressed with instructions to call with any issues or inadequate analgesia.  Reason for block:procedure for pain

## 2020-11-03 NOTE — Progress Notes (Signed)
Stephanie Velazquez is a 20 y.o. G1P0000 at [redacted]w[redacted]d admitted for induction of labor due to pre-eclampsia.  Subjective: Feels contractions some more. Not that frequent  Objective: BP (!) 147/90   Pulse 70   Temp 98 F (36.7 C) (Oral)   Resp 16   Ht 5\' 8"  (1.727 m)   Wt 84.2 kg   LMP  (LMP Unknown)   BMI 28.24 kg/m  No intake/output data recorded. No intake/output data recorded.  FHT:  FHR: 130s bpm, variability: moderate,  accelerations:  Present,  decelerations:  Absent UC:  2-3 min SVE:   Dilation: Fingertip Effacement (%): 50 Station: -3 Exam by:: Dr. [email or web address]  Labs: Lab Results  Component Value Date   WBC 9.4 11/03/2020   HGB 8.7 (L) 11/03/2020   HCT 28.2 (L) 11/03/2020   MCV 74.6 (L) 11/03/2020   PLT 222 11/03/2020    Assessment / Plan: Induction of labor due to pre-eclampsia   Labor: Progressing well.  Cervical exam still fingertip.  Was able to place Foley balloon at this cervical exam.  We will give additional dose of Cytotec and recheck in 4 hours. Preeclampsia:   On PO Labetalol 200mg  BID, BP initially in 150s/90s, improved to 140s-130s/60s-80s. Will continue to monitor BP and for neurological signs and symptoms. P:c at admission below reportable range.   Fetal Wellbeing:  Category I Pain Control:   Plans epidural I/D:   GBS positive urine, PCN Anticipated MOD:  NSVD  01/03/2021 11/03/2020, 6:27 PM

## 2020-11-04 ENCOUNTER — Encounter (HOSPITAL_COMMUNITY): Payer: Self-pay | Admitting: Obstetrics & Gynecology

## 2020-11-04 DIAGNOSIS — Z3A37 37 weeks gestation of pregnancy: Secondary | ICD-10-CM

## 2020-11-04 DIAGNOSIS — O134 Gestational [pregnancy-induced] hypertension without significant proteinuria, complicating childbirth: Secondary | ICD-10-CM

## 2020-11-04 DIAGNOSIS — O99824 Streptococcus B carrier state complicating childbirth: Secondary | ICD-10-CM

## 2020-11-04 LAB — CBC WITH DIFFERENTIAL/PLATELET
Abs Immature Granulocytes: 0.14 10*3/uL — ABNORMAL HIGH (ref 0.00–0.07)
Basophils Absolute: 0 10*3/uL (ref 0.0–0.1)
Basophils Relative: 0 %
Eosinophils Absolute: 0 10*3/uL (ref 0.0–0.5)
Eosinophils Relative: 0 %
HCT: 26.8 % — ABNORMAL LOW (ref 36.0–46.0)
Hemoglobin: 8.2 g/dL — ABNORMAL LOW (ref 12.0–15.0)
Immature Granulocytes: 1 %
Lymphocytes Relative: 6 %
Lymphs Abs: 1.1 10*3/uL (ref 0.7–4.0)
MCH: 22.7 pg — ABNORMAL LOW (ref 26.0–34.0)
MCHC: 30.6 g/dL (ref 30.0–36.0)
MCV: 74 fL — ABNORMAL LOW (ref 80.0–100.0)
Monocytes Absolute: 1 10*3/uL (ref 0.1–1.0)
Monocytes Relative: 5 %
Neutro Abs: 17.2 10*3/uL — ABNORMAL HIGH (ref 1.7–7.7)
Neutrophils Relative %: 88 %
Platelets: 225 10*3/uL (ref 150–400)
RBC: 3.62 MIL/uL — ABNORMAL LOW (ref 3.87–5.11)
RDW: 16.1 % — ABNORMAL HIGH (ref 11.5–15.5)
WBC: 19.5 10*3/uL — ABNORMAL HIGH (ref 4.0–10.5)
nRBC: 0.1 % (ref 0.0–0.2)

## 2020-11-04 LAB — RPR: RPR Ser Ql: NONREACTIVE

## 2020-11-04 MED ORDER — NIFEDIPINE ER OSMOTIC RELEASE 30 MG PO TB24
30.0000 mg | ORAL_TABLET | Freq: Every day | ORAL | Status: DC
  Administered 2020-11-04 – 2020-11-05 (×2): 30 mg via ORAL
  Filled 2020-11-04 (×2): qty 1

## 2020-11-04 MED ORDER — METHYLERGONOVINE MALEATE 0.2 MG/ML IJ SOLN
0.2000 mg | Freq: Once | INTRAMUSCULAR | Status: DC
  Administered 2020-11-04: 0.2 mg via INTRAMUSCULAR

## 2020-11-04 MED ORDER — COCONUT OIL OIL: 1.0000 "application " | TOPICAL_OIL | Status: DC | PRN

## 2020-11-04 MED ORDER — TRANEXAMIC ACID-NACL 1000-0.7 MG/100ML-% IV SOLN
INTRAVENOUS | Status: AC
  Administered 2020-11-04: 1000 mg
  Filled 2020-11-04: qty 100

## 2020-11-04 MED ORDER — LACTATED RINGERS IV SOLN: INTRAVENOUS | Status: DC

## 2020-11-04 MED ORDER — DIPHENHYDRAMINE HCL 25 MG PO CAPS: 25.0000 mg | ORAL_CAPSULE | Freq: Four times a day (QID) | ORAL | Status: DC | PRN

## 2020-11-04 MED ORDER — WITCH HAZEL-GLYCERIN EX PADS: 1.0000 "application " | MEDICATED_PAD | CUTANEOUS | Status: DC | PRN

## 2020-11-04 MED ORDER — AMMONIA AROMATIC IN INHA
RESPIRATORY_TRACT | Status: AC
  Filled 2020-11-04: qty 10

## 2020-11-04 MED ORDER — DOCUSATE SODIUM 100 MG PO CAPS
100.0000 mg | ORAL_CAPSULE | Freq: Two times a day (BID) | ORAL | Status: DC
  Administered 2020-11-05 – 2020-11-06 (×2): 100 mg via ORAL
  Filled 2020-11-04 (×3): qty 1

## 2020-11-04 MED ORDER — MEASLES, MUMPS & RUBELLA VAC IJ SOLR: 0.5000 mL | Freq: Once | INTRAMUSCULAR | Status: DC

## 2020-11-04 MED ORDER — SIMETHICONE 80 MG PO CHEW: 80.0000 mg | CHEWABLE_TABLET | ORAL | Status: DC | PRN

## 2020-11-04 MED ORDER — METHYLERGONOVINE MALEATE 0.2 MG/ML IJ SOLN
INTRAMUSCULAR | Status: AC
  Administered 2020-11-04: 0.2 mg
  Filled 2020-11-04: qty 1

## 2020-11-04 MED ORDER — ONDANSETRON HCL 4 MG PO TABS: 4.0000 mg | ORAL_TABLET | ORAL | Status: DC | PRN

## 2020-11-04 MED ORDER — PRENATAL MULTIVITAMIN CH: 1.0000 | ORAL_TABLET | Freq: Every day | ORAL | Status: DC

## 2020-11-04 MED ORDER — ONDANSETRON HCL 4 MG/2ML IJ SOLN: 4.0000 mg | INTRAMUSCULAR | Status: DC | PRN

## 2020-11-04 MED ORDER — LEVONORGESTREL 20.1 MCG/DAY IU IUD: 1.0000 | INTRAUTERINE_SYSTEM | Freq: Once | INTRAUTERINE | Status: DC

## 2020-11-04 MED ORDER — ACETAMINOPHEN 500 MG PO TABS
1000.0000 mg | ORAL_TABLET | Freq: Three times a day (TID) | ORAL | Status: DC
  Filled 2020-11-04 (×3): qty 2

## 2020-11-04 MED ORDER — TETANUS-DIPHTH-ACELL PERTUSSIS 5-2.5-18.5 LF-MCG/0.5 IM SUSY: 0.5000 mL | PREFILLED_SYRINGE | Freq: Once | INTRAMUSCULAR | Status: DC

## 2020-11-04 MED ORDER — BENZOCAINE-MENTHOL 20-0.5 % EX AERO
1.0000 "application " | INHALATION_SPRAY | CUTANEOUS | Status: DC | PRN
  Administered 2020-11-04: 1 via TOPICAL
  Filled 2020-11-04: qty 56

## 2020-11-04 MED ORDER — DIBUCAINE (PERIANAL) 1 % EX OINT: 1.0000 "application " | TOPICAL_OINTMENT | CUTANEOUS | Status: DC | PRN

## 2020-11-04 MED ORDER — SENNOSIDES-DOCUSATE SODIUM 8.6-50 MG PO TABS
2.0000 | ORAL_TABLET | ORAL | Status: DC
  Administered 2020-11-04: 2 via ORAL
  Filled 2020-11-04: qty 2

## 2020-11-04 MED ORDER — IBUPROFEN 800 MG PO TABS
800.0000 mg | ORAL_TABLET | Freq: Three times a day (TID) | ORAL | Status: DC
  Administered 2020-11-04 – 2020-11-06 (×4): 800 mg via ORAL
  Filled 2020-11-04 (×5): qty 1

## 2020-11-04 MED ORDER — TRANEXAMIC ACID-NACL 1000-0.7 MG/100ML-% IV SOLN: 1000.0000 mg | INTRAVENOUS | Status: DC

## 2020-11-04 NOTE — Progress Notes (Signed)
Labor Progress Note Stephanie Velazquez is a 20 y.o. G1P0000 at [redacted]w[redacted]d presented for IOL due to pre-e without severe features  S: Patient feeling overall comfortable with epidural in place.   O:  BP 137/69   Pulse 82   Temp 98.3 F (36.8 C) (Oral)   Resp 16   Ht 5\' 8"  (1.727 m)   Wt 84.2 kg   LMP  (LMP Unknown)   SpO2 97%   BMI 28.24 kg/m  EFM: 130/moderate/accels and rare variables present   CVE: Dilation: 5.5 Effacement (%): 80 Station: -2 Presentation: Vertex Exam by:: Dr. [email or web address]   A&P: 20 y.o. G1P0000 [redacted]w[redacted]d IOL for pre-e with severe features #Labor: Progressing well. Making slow, steady change. Will continue to titrate pitocin. Consider AROM at next check.  #Pain: Epidural in place #FWB: Cat I #GBS positive PCN  #Pre-E without SF: Most recent BP 110/89. Has been in 130s/70s. Continue BID labetalol.   [redacted]w[redacted]d, MD 4:00 AM

## 2020-11-04 NOTE — Lactation Note (Signed)
This note was copied from a baby's chart. Lactation Consultation Note  Patient Name: Stephanie Velazquez [id number] Date: 11/04/2020 Reason for consult: Initial assessment;Mother's request;Difficult latch;Primapara;1st time breastfeeding;Early term 37-38.6wks;Other (Comment) (PIH) Age: < 7 hrs   LC assisted with latching infant at breast looking for signs of milk transfer.   Mom nipples right smaller on left. We reviewed how to get a deep latch and prevent shallow latch.   Plan 1. To feed based on cues 8-12 24 hr period  2. Mom to latch at breast and listen for audible swallows 3. If unable to latch infant, Mom to offer EBM via spoon  4. I and O sheet reviewed.  5 LC brochure of inpatient and outpatient services reviewed.  All questions answered at the end of the visit.   Maternal Data Has patient been taught Hand Expression?: Yes Does the patient have breastfeeding experience prior to this delivery?: No  Feeding Mother's Current Feeding Choice: Breast Milk  LATCH Score Latch: Repeated attempts needed to sustain latch, nipple held in mouth throughout feeding, stimulation needed to elicit sucking reflex.  Audible Swallowing: Spontaneous and intermittent  Type of Nipple: Everted at rest and after stimulation  Comfort (Breast/Nipple): Soft / non-tender  Hold (Positioning): Assistance needed to correctly position infant at breast and maintain latch.  LATCH Score: 8   Lactation Tools Discussed/Used    Interventions Interventions: Breast feeding basics reviewed;Assisted with latch;Adjust position;Skin to skin;Support pillows;Breast massage;Position options;Hand express;Expressed milk;Education;Breast compression  Discharge Pump: Personal  Consult Status Consult Status: Follow-up Date: 11/05/20 Follow-up type: In-patient    Stephanie Springsteen  Velazquez 11/04/2020, 11:10 PM

## 2020-11-04 NOTE — Progress Notes (Signed)
Stephanie Velazquez is a 20 y.o. G1P0000 at [redacted]w[redacted]d by admitted for PreE without severe features  Subjective: Comfortable with epidural  Objective: BP (!) 146/82   Pulse 81   Temp 98.3 F (36.8 C) (Oral)   Resp 16   Ht 5\' 8"  (1.727 m)   Wt 84.2 kg   LMP  (LMP Unknown)   SpO2 97%   BMI 28.24 kg/m  No intake/output data recorded. No intake/output data recorded.  FHT:  FHR: 150 bpm, variability: moderate,  accelerations:  Present,  decelerations:  Absent UC:   regular, every 2 minutes SVE:   Dilation: 5.5 Effacement (%): 80 Station: -2 Exam by:: [email or web address], RN AROM to clear fluid  Labs: Lab Results  Component Value Date   WBC 14.4 (H) 11/03/2020   HGB 8.1 (L) 11/03/2020   HCT 25.9 (L) 11/03/2020   MCV 74.2 (L) 11/03/2020   PLT 205 11/03/2020    Assessment / Plan: Spontaneous labor, progressing normally  Labor:  Progressing on pitocin slowly. AROM done.  Preeclampsia:  no signs or symptoms of toxicity, Bps currently mild range. Fetal Wellbeing:  Category I Pain Control:  Epidural I/D:   GBS pos - PCN Anticipated MOD:  NSVD  01/03/2021 11/04/2020, 10:15 AM

## 2020-11-04 NOTE — Discharge Summary (Addendum)
Postpartum Discharge Summary    Patient Name: Stephanie Velazquez DOB: [birth year] MRN: [id number]  Date of admission: 11/03/2020 Delivery date:11/04/2020  Delivering provider: Genia Del  Date of discharge: 11/06/2020  Admitting diagnosis: Gestational hypertension, third trimester [O13.3] Intrauterine pregnancy: [redacted]w[redacted]d     Secondary diagnosis:  Principal Problem:   Vaginal delivery Active Problems:   GBS bacteriuria   Marginal insertion of umbilical cord affecting management of mother   Mild pre-eclampsia   Anemia in pregnancy, third trimester   Gestational hypertension, third trimester  Additional problems: None     Discharge diagnosis: Term Pregnancy Delivered and Preeclampsia (mild)                                              Post partum procedures:blood transfusion Augmentation: AROM, Pitocin, Cytotec, and IP Foley Complications: None  Hospital course: Induction of Labor With Vaginal Delivery   20 y.o. yo G1P0000 at [redacted]w[redacted]d was admitted to the hospital 11/03/2020 for induction of labor.  Indication for induction: Preeclampsia without severe features (by elevated P/C ratio).  Patient had an uncomplicated labor course as follows: Membrane Rupture Time/Date: 9:30 AM ,11/04/2020   Delivery Method:Vaginal, Spontaneous  Episiotomy: None  Lacerations:  1st degree;Labial  Details of delivery can be found in separate delivery note.  Patient had a postpartum course remarkable for being started on Procardia $RemoveBefo'60mg'GauSQFOZwNs$  due to mild-mod range elevations on PPD#1. It was also recommended that she receive a blood tx of 1u due to PPD#1 Hgb of 6.5 which she tolerated well with a post tx Hgb of 7.6. Patient is discharged home 11/06/20.  Newborn Data: Birth date:11/04/2020  Birth time:2:21 PM  Gender:Female  Living status:Living  Apgars:8 ,8  Weight:3351 g (7lb 6.2oz)  Magnesium Sulfate received: No BMZ received: No Rhophylac: N/A MMR: N/A - Immune  T-DaP:  Declined Flu:  Declined Transfusion: Yes- 1u PRBCs  Physical exam  Vitals:   11/05/20 1017 11/05/20 1452 11/05/20 2021 11/06/20 0528  BP: (!) 140/94 135/84 (!) 142/86 125/81  Pulse: 91 79 79 71  Resp: $Remo'19 18 18 16  'gGNOr$ Temp: 98.2 F (36.8 C) 98.3 F (36.8 C)  97.8 F (36.6 C)  TempSrc: Oral Oral  Oral  SpO2: 96% 97%    Weight:      Height:       General: alert, cooperative, and no distress Lochia: appropriate Uterine Fundus: firm Incision: N/A DVT Evaluation: No evidence of DVT seen on physical exam.  Labs: Lab Results  Component Value Date   WBC 14.6 (H) 11/05/2020   HGB 7.6 (L) 11/05/2020   HCT 24.6 (L) 11/05/2020   MCV 75.0 (L) 11/05/2020   PLT 193 11/05/2020   CMP Latest Ref Rng & Units 11/03/2020  Glucose 70 - 99 mg/dL 102(H)  BUN 6 - 20 mg/dL 7  Creatinine 0.44 - 1.00 mg/dL 0.84  Sodium 135 - 145 mmol/L 137  Potassium 3.5 - 5.1 mmol/L 4.2  Chloride 98 - 111 mmol/L 108  CO2 22 - 32 mmol/L 20(L)  Calcium 8.9 - 10.3 mg/dL 8.5(L)  Total Protein 6.5 - 8.1 g/dL 5.7(L)  Total Bilirubin 0.3 - 1.2 mg/dL 0.4  Alkaline Phos 38 - 126 U/L 112  AST 15 - 41 U/L 19  ALT 0 - 44 U/L 11   Edinburgh Score: Edinburgh Postnatal Depression Scale Screening Tool 11/04/2020  I have  been able to laugh and see the funny side of things. 0  I have looked forward with enjoyment to things. 0  I have blamed myself unnecessarily when things went wrong. 1  I have been anxious or worried for no good reason. 2  I have felt scared or panicky for no good reason. 2  Things have been getting on top of me. 0  I have been so unhappy that I have had difficulty sleeping. 0  I have felt sad or miserable. 0  I have been so unhappy that I have been crying. 0  The thought of harming myself has occurred to me. 0  Edinburgh Postnatal Depression Scale Total 5     After visit meds:  Allergies as of 11/06/2020       Reactions   Flagyl [metronidazole] Hives   Monistat [miconazole] Hives   Burning inside         Medication List     TAKE these medications    famotidine 20 MG tablet Commonly known as: PEPCID Take 1 tablet (20 mg total) by mouth 2 (two) times daily.   ferrous sulfate 325 (65 FE) MG tablet Commonly known as: FerrouSul Take 1 tablet (325 mg total) by mouth every other day.   ibuprofen 800 MG tablet Commonly known as: ADVIL Take 1 tablet (800 mg total) by mouth every 8 (eight) hours.   multivitamin-prenatal 27-0.8 MG Tabs tablet Take 1 tablet by mouth daily at 12 noon.   NIFEdipine 60 MG 24 hr tablet Commonly known as: ADALAT CC Take 1 tablet (60 mg total) by mouth daily.         Discharge home in stable condition Infant Feeding: Breast Infant Disposition:home with mother Discharge instruction: per After Visit Summary and Postpartum booklet. Activity: Advance as tolerated. Pelvic rest for 6 weeks.  Diet: routine diet Future Appointments: Future Appointments  Date Time Provider Noank  11/11/2020  8:30 AM [facility] NURSE [facility]  12/09/2020 11:15 AM Anyanwu, Sallyanne Havers, MD [facility]   Follow up Visit: Message sent to [facility] by Dr. Gwenlyn Perking on 11/04/20.   Please schedule this patient for a In person postpartum visit in 4 weeks with the following provider: Any provider. Additional Postpartum F/U: BP check 1 week  High risk pregnancy complicated by: HTN (Pre-eclampsia without severe features)  Delivery mode:  Vaginal, Spontaneous  Anticipated Birth Control:  Unsure   11/06/2020 Gifford Shave, MD  CNM attestation I have seen and examined this patient and agree with above documentation in the resident's note.   Stephanie Velazquez is a 77 y.o. G1P1001 s/p vag del.   Pain is well controlled.  Plan for birth control is  unsure .  Method of Feeding: breast  PE:  BP 125/81 (BP Location: Right Arm)   Pulse 71   Temp 97.8 F (36.6 C) (Oral)   Resp 16   Ht $R'5\' 8"'RW$  (1.727 m)   Wt 84.2 kg   LMP  (LMP Unknown)   SpO2 97%   Breastfeeding  Unknown   BMI 28.24 kg/m  Fundus firm  Recent Labs    11/05/20 0508 11/05/20 1452  HGB 6.5* 7.6*  HCT 21.3* 24.6*     Plan: discharge today - postpartum care discussed - f/u clinic in 1 week for BP check; 4 weeks for postpartum visit   Myrtis Ser, CNM 8:41 AM 11/06/2020

## 2020-11-04 NOTE — Lactation Note (Signed)
This note was copied from a baby's chart. Lactation Consultation Note  Patient Name: Girl Dailyn Reith Today's Date: 11/04/2020 Reason for consult: L&D Initial assessment;Early term 37-38.6wks;1st time breastfeeding;Primapara Age:22 hours   L&D Initial LC Consult: Visited with family < 1 hour after birth RN in room when I arrived.  Hand expression demonstrated with no visible colostrum drops at this time. Repeated attempts needed before baby would latch.  Once latched, she was able to take a few intermittent sucks.  Observed her for 5 minutes before she showed no further interest.  Placed her STS on mother's chest with blanket to cover.  Reassured mother that lactation assistance will be provided on the M/B unit.  Father and grandmother present.  Allowed time for family bonding.   Maternal Data    Feeding Mother's Current Feeding Choice: Breast Milk  LATCH Score Latch: Repeated attempts needed to sustain latch, nipple held in mouth throughout feeding, stimulation needed to elicit sucking reflex.  Audible Swallowing: None  Type of Nipple: Everted at rest and after stimulation  Comfort (Breast/Nipple): Soft / non-tender  Hold (Positioning): Assistance needed to correctly position infant at breast and maintain latch.  LATCH Score: 6   Lactation Tools Discussed/Used    Interventions Interventions: Assisted with latch;Skin to skin  Discharge    Consult Status Consult Status: Follow-up from L&D    Irene Pap Addylin Manke 11/04/2020, 3:34 PM

## 2020-11-04 NOTE — Progress Notes (Signed)
Labor Progress Note Niema T Bobo is a 20 y.o. G1P0000 at [redacted]w[redacted]d presented for IOL due to pre-E without severe features.   S: Patient is reporting increased pressure with contractions. She denies sharp pain. Partner at bedside.   O:  BP 137/81   Pulse 91   Temp 98.3 F (36.8 C) (Oral)   Resp 16   Ht 5\' 8"  (1.727 m)   Wt 84.2 kg   LMP  (LMP Unknown)   SpO2 97%   BMI 28.24 kg/m   EFM: Baseline 120 bpm/moderate variability/+ accels/variable decels  CVE: Dilation: 10 Dilation Complete Date: 11/04/20 Dilation Complete Time: 1240 Effacement (%): 100 Station: 0 Presentation: Vertex Exam by:: [email or web address], RN   A&P: 20 y.o. G1P0000 [redacted]w[redacted]d   #Labor: Progressing well. SVE now 10/100/0. Patient is very uncomfortable due to pressure with contractions. Trial pushes with minimal movement due to patient anxiety/discomfort. Will labor down for 1 hour and then re-attempt pushing. Dr. [redacted]w[redacted]d updated on plan of care.  #Pain: Epidural  #FWB: Cat 2 due to intermittent variable decels; reassuring variability and continues to have accels  #GBS positive > PCN  #Pre-E without severe features: Continues to have normal to mild range BP. No symptoms. Continue Labetalol 200 mg BID.   Para March, MD 1:12 PM

## 2020-11-05 LAB — CBC
HCT: 21.3 % — ABNORMAL LOW (ref 36.0–46.0)
Hemoglobin: 6.5 g/dL — CL (ref 12.0–15.0)
MCH: 22.9 pg — ABNORMAL LOW (ref 26.0–34.0)
MCHC: 30.5 g/dL (ref 30.0–36.0)
MCV: 75 fL — ABNORMAL LOW (ref 80.0–100.0)
Platelets: 193 10*3/uL (ref 150–400)
RBC: 2.84 MIL/uL — ABNORMAL LOW (ref 3.87–5.11)
RDW: 16.2 % — ABNORMAL HIGH (ref 11.5–15.5)
WBC: 14.6 10*3/uL — ABNORMAL HIGH (ref 4.0–10.5)
nRBC: 0 % (ref 0.0–0.2)

## 2020-11-05 LAB — PREPARE RBC (CROSSMATCH)

## 2020-11-05 LAB — HEMOGLOBIN AND HEMATOCRIT, BLOOD
HCT: 24.6 % — ABNORMAL LOW (ref 36.0–46.0)
Hemoglobin: 7.6 g/dL — ABNORMAL LOW (ref 12.0–15.0)

## 2020-11-05 MED ORDER — SODIUM CHLORIDE 0.9% IV SOLUTION: Freq: Once | INTRAVENOUS | Status: DC

## 2020-11-05 NOTE — Progress Notes (Signed)
Date and time results received: 11/05/20  0617am  Test: Hgb Critical Value: 6.5  Name of Provider Notified: Scherry Ran, MD  Orders Received? Or Actions Taken?: New order placed to transfuse 1u RBC.

## 2020-11-05 NOTE — Progress Notes (Signed)
Post Partum Day 1 Subjective: Feeling well this morning, slightly fatigued and lightheaded when up going to the bathroom. Denies HA, vision changes, SOB. Mild pain in abdomen well controlled with pain meds, light vaginal bleeding with a few small clots overnight but none this morning. Passing flatus.   Objective: Blood pressure 127/77, pulse 82, temperature 98.1 F (36.7 C), temperature source Oral, resp. rate 18, height 5\' 8"  (1.727 m), weight 84.2 kg, SpO2 96 %, unknown if currently breastfeeding.  Physical Exam:  General: alert, cooperative, and no distress Lochia: appropriate Uterine Fundus: firm Incision: none DVT Evaluation: No evidence of DVT seen on physical exam.  Recent Labs    11/04/20 1513 11/05/20 0508  HGB 8.2* 6.5*  HCT 26.8* 21.3*    Assessment/Plan: Plan for discharge tomorrow Hgb 6.5 this morning, 1 unit PRBC given Hx of pre-e, first dose Procardia given 10pm, continue to monitor BP Breastfeeding Plans to get IUD at her 6 week postpartum visit   LOS: 2 days   11/07/20 11/05/2020, 7:34 AM

## 2020-11-05 NOTE — Anesthesia Postprocedure Evaluation (Signed)
Anesthesia Post Note  Patient: Stephanie Velazquez  Procedure(s) Performed: AN AD HOC LABOR EPIDURAL     Patient location during evaluation: Mother Baby Anesthesia Type: Epidural Level of consciousness: oriented, awake and awake and alert Pain management: pain level controlled Vital Signs Assessment: post-procedure vital signs reviewed and stable Respiratory status: spontaneous breathing, respiratory function stable and nonlabored ventilation Cardiovascular status: stable Postop Assessment: no headache, adequate PO intake, able to ambulate, patient able to bend at knees and no apparent nausea or vomiting Anesthetic complications: no   No notable events documented.  Last Vitals:  Vitals:   11/05/20 0801 11/05/20 1017  BP: (!) 134/93 (!) 140/94  Pulse: 78 91  Resp: 18 19  Temp: 36.8 C 36.8 C  SpO2: 98% 96%    Last Pain:  Vitals:   11/05/20 1017  TempSrc: Oral  PainSc:    Pain Goal:                   Yarima Penman

## 2020-11-05 NOTE — Lactation Note (Signed)
This note was copied from a baby's chart. Lactation Consultation Note  Patient Name: Stephanie Velazquez [id number] Date: 11/05/2020 Reason for consult: Follow-up assessment;Early term 37-38.6wks;1st time breastfeeding;Primapara Age:22 hours  LC in to visit with P1 Mom of ET infant.  Mom holding baby clothed against her chest.  Mom states she knows STS is best, but wanted to see her in the outfit.  Will place baby STS after a little while.  Mom describes a deep latch, without any discomfort or pinching of her nipples.  Baby has fed 8 times in first 24 hrs. 1 stool, 0 voids.  Baby has been jittery and CBG drawn by lab, results pending.  Encouraged Mom to place baby STS and watch for feeding cues and call for her RN or LC to assess baby's latch to the breast.    Interventions Interventions: Breast feeding basics reviewed;Skin to skin;Breast massage;Hand express  Consult Status Consult Status: Follow-up Date: 11/06/20 Follow-up type: In-patient    Stephanie Velazquez 11/05/2020, 3:41 PM

## 2020-11-05 NOTE — Lactation Note (Signed)
This note was copied from a baby's chart. Lactation Consultation Note  Patient Name: Stephanie Velazquez [id number] Date: 11/05/2020 Reason for consult: Follow-up assessment;Early term 37-38.6wks;1st time breastfeeding;Primapara Age:22 hours  CBG at 25 hrs old was 72.  NP order donor milk supplement.  Mom feeding baby by bottle when LC entered room.  Mom assisted to double pump on initiation setting and sized to a 21 mm flange.  Mom pumped 15 ml colostrum.  Mom to offer EBM before donor milk.  Mom was anemic pre-delivery with hgb of 8.2.  This am, Mom was symptomatic and Hgb was 6.5.  Mom received 1 unit of PRBC.  Baby has substantial bruising of her head from birth.   Talked about subsequent jaundice probability and possible sleepiness on breast.  Pumping recommended at least after every other feeding.  Plan- 1- Keep baby STS as much as possible 2- Offer breast with cues, asking RN to assess for a deep latch 3-Supplement with EBM by paced bottle 4- Pump both breasts on initiation setting for 15 mins. 5- If baby breastfeeds well, with evidence of milk transfer, and baby is contented after breastfeeding, Mom can omit supplement/pumping.   Lactation Tools Discussed/Used Tools: Pump;Flanges;Bottle Flange Size: 21 Breast pump type: Double-Electric Breast Pump Pump Education: Setup, frequency, and cleaning;Milk Storage Reason for Pumping: Support milk supply/MD ordered supplement Pumping frequency: after breastfeeding when baby is supplemented Pumped volume: 15 mL  Interventions Interventions: DEBP;Hand pump;Expressed milk   Consult Status Consult Status: Follow-up Date: 11/06/20 Follow-up type: In-patient    [name] 11/05/2020, 5:15 PM

## 2020-11-06 ENCOUNTER — Other Ambulatory Visit (HOSPITAL_COMMUNITY): Payer: Self-pay

## 2020-11-06 ENCOUNTER — Encounter: Payer: Medicaid Other | Admitting: Obstetrics and Gynecology

## 2020-11-06 LAB — TYPE AND SCREEN
ABO/RH(D): AB POS
Antibody Screen: NEGATIVE
Unit division: 0

## 2020-11-06 LAB — BPAM RBC
Blood Product Expiration Date: 202210012359
ISSUE DATE / TIME: 202209140738
Unit Type and Rh: 8400

## 2020-11-06 LAB — SURGICAL PATHOLOGY

## 2020-11-06 MED ORDER — NIFEDIPINE ER OSMOTIC RELEASE 30 MG PO TB24
60.0000 mg | ORAL_TABLET | Freq: Every day | ORAL | Status: DC
  Administered 2020-11-06: 60 mg via ORAL
  Filled 2020-11-06: qty 2

## 2020-11-06 MED ORDER — IBUPROFEN 800 MG PO TABS: 800.0000 mg | ORAL_TABLET | Freq: Three times a day (TID) | ORAL | 0 refills | Status: DC

## 2020-11-06 MED ORDER — NIFEDIPINE ER 60 MG PO TB24
60.0000 mg | ORAL_TABLET | Freq: Every day | ORAL | 0 refills | Status: DC
  Filled 2020-11-06: qty 30, 30d supply, fill #0

## 2020-11-06 NOTE — Lactation Note (Addendum)
This note was copied from a baby's chart. Lactation Consultation Note  Patient Name: Stephanie Velazquez Date: 11/06/2020 Reason for consult: Follow-up assessment;Primapara;1st time breastfeeding;Early term 37-38.6wks;Infant weight loss;Other (Comment) (5 % weight loss / elevated Bili)  ( see results review )  Age:22 hours As LC entered the room, MBURN assessing baby.  LC assisted to place baby STS and guide mom with the cross cradle - Latch score 8  Depth obtained/ increased swallows/ and per mom comfortable.  Breast are fuller today and per mom pumped off 13 ml and 15 ml .  LC recommended due to the [facility] after 3-4 feedings post pump 10 -15 mins when the baby is not cluster feeding.  LC praised mom her milk is coming in.  [facility] reviewed basics / and BF D/C teaching. Mom and dad receptive to teaching.  Due to mom having a PPH . Recommended a LC O?P F/U next week Tuesday or Wednesday and both receptive. LC placed a request.   Maternal Data Has patient been taught Hand Expression?: Yes  Feeding Mother's Current Feeding Choice: Breast Milk  LATCH Score Latch: Grasps breast easily, tongue down, lips flanged, rhythmical sucking.  Audible Swallowing: Spontaneous and intermittent  Type of Nipple: Everted at rest and after stimulation  Comfort (Breast/Nipple): Filling, red/small blisters or bruises, mild/mod discomfort  Hold (Positioning): Assistance needed to correctly position infant at breast and maintain latch.  LATCH Score: 8   Lactation Tools Discussed/Used Tools: Shells;Pump;Flanges Flange Size: 21;24 Breast pump type: Manual;Double-Electric Breast Pump Pump Education: Milk Storage  Interventions Interventions: Breast feeding basics reviewed;Assisted with latch;Skin to skin;Breast massage;Breast compression;Adjust position;Support pillows;Position options;Shells;Hand pump;DEBP;Education  Discharge Discharge Education: Engorgement and breast care;Warning signs for feeding  baby;Outpatient recommendation;Outpatient Epic message sent Pump: Personal;DEBP;Manual  Consult Status Consult Status: Complete Date: 11/06/20    Kathrin Greathouse 11/06/2020, 9:19 AM

## 2020-11-10 ENCOUNTER — Ambulatory Visit: Payer: Medicaid Other

## 2020-11-11 ENCOUNTER — Encounter: Payer: Medicaid Other | Admitting: Obstetrics and Gynecology

## 2020-11-13 ENCOUNTER — Ambulatory Visit (INDEPENDENT_AMBULATORY_CARE_PROVIDER_SITE_OTHER): Payer: Medicaid Other | Admitting: *Deleted

## 2020-11-13 ENCOUNTER — Other Ambulatory Visit: Payer: Self-pay

## 2020-11-13 VITALS — BP 142/97 | HR 69

## 2020-11-13 DIAGNOSIS — O165 Unspecified maternal hypertension, complicating the puerperium: Secondary | ICD-10-CM

## 2020-11-13 LAB — CBC
Hematocrit: 31.5 % — ABNORMAL LOW (ref 34.0–46.6)
Hemoglobin: 9.8 g/dL — ABNORMAL LOW (ref 11.1–15.9)
MCH: 22.6 pg — ABNORMAL LOW (ref 26.6–33.0)
MCHC: 31.1 g/dL — ABNORMAL LOW (ref 31.5–35.7)
MCV: 73 fL — ABNORMAL LOW (ref 79–97)
Platelets: 414 10*3/uL (ref 150–450)
RBC: 4.34 x10E6/uL (ref 3.77–5.28)
RDW: 16.6 % — ABNORMAL HIGH (ref 11.7–15.4)
WBC: 5.4 10*3/uL (ref 3.4–10.8)

## 2020-11-13 MED ORDER — LISINOPRIL 10 MG PO TABS: 10.0000 mg | ORAL_TABLET | Freq: Every day | ORAL | 1 refills | Status: DC

## 2020-11-13 NOTE — Progress Notes (Signed)
Subjective:  Stephanie Velazquez is a 20 y.o. female here for BP check. Pt was having symptoms of shortness of breath, dizziness on the Procardia and has nto taken it for the past 2 days.   Hypertension ROS: Patient denies any headaches, visual symptoms, RUQ/epigastric pain or other concerning symptoms.  Objective:  BP (!) 142/97   Pulse 69   Appearance alert, well appearing, and in no distress. General exam BP noted to be not well controlled today in office.    Assessment:   Blood Pressure needs improvement.   Plan:  Discussed with Dr Shawnie Pons, will change medication Lisinopril 10mg  daily, will also check CBC. Pt to recheck BP next week and let know what it is.  [place]

## 2020-11-14 NOTE — Progress Notes (Signed)
Patient seen and assessed by nursing staff.  Agree with documentation and plan.  

## 2020-11-17 ENCOUNTER — Encounter: Payer: Medicaid Other | Admitting: Obstetrics and Gynecology

## 2020-11-18 ENCOUNTER — Telehealth (HOSPITAL_COMMUNITY): Payer: Self-pay | Admitting: *Deleted

## 2020-11-18 NOTE — Telephone Encounter (Signed)
Patient voiced no concerns regarding her own health. EPDS = 3. Patient voiced no questions or concerns regarding baby at this time. Patient reported infant sleeps in a bassinet on her back. RN reviewed ABCs of safe sleep - patient verbalized understanding. Patient requested RN email information on hospital's virtual postpartum classes and support groups. Email sent. Deforest Hoyles, RN , 11/18/20, [phone].

## 2020-12-05 ENCOUNTER — Other Ambulatory Visit: Payer: Self-pay | Admitting: Family Medicine

## 2020-12-09 ENCOUNTER — Telehealth (INDEPENDENT_AMBULATORY_CARE_PROVIDER_SITE_OTHER): Payer: Medicaid Other | Admitting: Obstetrics & Gynecology

## 2020-12-09 DIAGNOSIS — O9122 Nonpurulent mastitis associated with the puerperium: Secondary | ICD-10-CM | POA: Diagnosis not present

## 2020-12-09 DIAGNOSIS — Z30011 Encounter for initial prescription of contraceptive pills: Secondary | ICD-10-CM

## 2020-12-09 MED ORDER — CEFADROXIL 500 MG PO CAPS: 500.0000 mg | ORAL_CAPSULE | Freq: Two times a day (BID) | ORAL | 0 refills | Status: AC

## 2020-12-09 MED ORDER — IBUPROFEN 800 MG PO TABS: 800.0000 mg | ORAL_TABLET | Freq: Three times a day (TID) | ORAL | 2 refills | Status: DC | PRN

## 2020-12-09 MED ORDER — NORETHINDRONE 0.35 MG PO TABS: 1.0000 | ORAL_TABLET | Freq: Every day | ORAL | 11 refills | Status: DC

## 2020-12-09 NOTE — Patient Instructions (Signed)
Breastfeeding and Mastitis ?Mastitis is inflammation of the breast tissue. It can occur in women who are breastfeeding. This can make breastfeeding painful. Mastitis will sometimes go away on its own, especially if it is not caused by an infection (noninfectious mastitis). A health care provider will help determine if medical treatment is needed. Treatment may be needed if the condition is caused by a bacterial infection (infectious mastitis). ?What are the causes? ?Inflammation may be caused by a blocked milkduct, which can happen when too much milk builds up in the breast. Causes of excess milk in the breast can include: ?Poor latch. If your child is not latched on to your breast properly, he or she may not empty your breast completely while breastfeeding. ?Allowing too much time to pass between feedings or scheduling feedings instead of feeding on demand. ?Wearing a bra or other clothing that is too tight. This puts extra pressure on your milk ducts so milk does not flow through them as it should. ?Milk remaining in the breast because it is overfilled (engorged). This is often from oversupply or missed feedings. ?Mastitis is often caused by a bacterial infection. Bacteria may enter the breast tissue through cuts, cracks, or openings in the skin near the nipple area. Cracks in the skin often develop when your child does not latch on properly to your breast. ?What increases the risk? ?The following factors may make you more likely to develop this condition: ?History of mastitis. ?Poor breastfeeding technique. ?Poor nutrition and hydration. ?Smoking. ?Stress. ?Tiredness (fatigue). ?What are the signs or symptoms? ?Symptoms of this condition include: ?Swelling, redness, tenderness, and pain in an area of your breast. These usually affect the upper part of the breast, toward the armpit region. In most cases, these symptoms affect only one breast. In some cases, the symptoms may occur in both breasts at the same time  and affect a larger portion of breast tissue. ?Swelling of the glands under your arm on the same side. ?Fatigue, headache, and flu-like muscle aches. ?Fever. ?Rapid pulse. ?Symptoms usually last 2-5 days. Breast pain and redness are at their worst on days 2 and 3, and they usually go away by day 5. If an infection is left to worsen, a collection of pus, or an abscess, may develop. ?How is this diagnosed? ?This condition is usually diagnosed based on: ?Your symptoms. ?A physical exam. ?In some cases, tests may be done, such as: ?Blood tests to check whether your body is fighting a bacterial infection. ?Mammogram or ultrasound tests to rule out other problems or diseases. ?Fluid tests. If an abscess has developed, the fluid in the abscess may be removed with a needle. The fluid may be checked to see whether bacteria are present. ?Breast milk testing. A sample of your breast milk may be tested for bacteria. ?How is this treated? ?This condition will sometimes go away on its own with continued breastfeeding or pumping. Your health care provider may choose to wait 24 hours after first seeing you to decide whether treatment is needed. If treatment is needed, it may include: ?Continuing to breastfeed or pump from both breasts to allow adequate milk flow and prevent an abscess from forming. Strategies to support breastfeeding include: ?Breastfeeding from the affected side first. ?Using breast massage. ?Applying warmth to the affected area before breastfeeding. This can help with milk flow. Try a warm cloth or warm shower. ?Self-care, such as rest and drinking more fluids. ?Pain medicine. ?Antibiotic medicine to treat a bacterial infection. ?Using a fine   needle to remove fluid from an abscess if one has developed. ?Follow these instructions at home: ?Medicines ?Take over-the-counter and prescription medicines only as told by your health care provider. ?If you were prescribed an antibiotic medicine, take it as told by your  health care provider. Do not stop taking the antibiotic even if you start to feel better. ?Managing pain and swelling ?If directed, put ice on the affected area of your breast right after breastfeeding or pumping. To do this: ?Put ice in a plastic bag. ?Place a towel between your skin and the bag. ?Leave the ice on for 20 minutes, 2-3 times a day. ?Take off the ice if your skin turns bright red. This is very important. ? ?Breast care ?Keep your nipples clean and dry. ?If you go back to work, pump your breasts while at work to stay on your nursing schedule. ?Do not allow your breasts to become engorged. ?Do not wear a tight or underwire bra. Wear a soft, supportive bra. ?Check your nipples for any cracks or blisters. If you find any, talk with your health care provider or a breastfeeding specialist (lactation consultant) for treatment. ?Breastfeeding tips ?Continue to empty your breasts as often as possible, either by breastfeeding or using an electric breast pump. This is very important for preventing mastitis or treating it if it develops. Ask your health care provider whether you need to change your breastfeeding or pumping routine. ?If directed, apply moist heat to the affected area of your breast right before breastfeeding or pumping. Use the heat source that your health care provider recommends. ?During breastfeeding, empty the first breast completely before going to the other breast. If your child is not emptying your breasts completely, use a breast pump to empty your breasts. ?Use breast massage during feeding or pumping sessions. ?General instructions ?Drink enough fluid to keep your urine pale yellow. This is especially important if you have a fever. ?Get plenty of rest. ?Wash your hands often with soap and water for at least 20 seconds. ?Wash pump parts with hot and soapy water. ?Keep all follow-up visits. This is important. ?Where to find more information ?La Leche [employer] International: llli.org ?Contact  a health care provider if: ?You have pus-like discharge from your breast. ?You have a fever. ?Your symptoms do not improve within 2 days of starting treatment. ?Your symptoms return after you have recovered from a breast infection. ?Get help right away if: ?Your pain and swelling are getting worse. ?You have pain that is not controlled with medicine. ?You have a red line going from your breast toward your armpit. ?Summary ?Mastitis is inflammation of the breast tissue. It is often caused by a blocked milk duct or bacteria. ?If needed, treatment may include continuing to breastfeed or pump, applying warmth or cold and other breastfeeding strategies, self-care, and medicines. ?If you were prescribed an antibiotic medicine, take it as told by your health care provider. Do not stop taking the antibiotic even if you start to feel better. ?Continue to empty your breasts as often as possible either by breastfeeding or using an electric breast pump. ?This information is not intended to replace advice given to you by your health care provider. Make sure you discuss any questions you have with your health care provider. ?Document Revised: 06/13/2019 Document Reviewed: 06/13/2019 ?Elsevier Patient Education ? 2022 Elsevier Inc. ? ?

## 2020-12-09 NOTE — Progress Notes (Signed)
Provider location: [facility]   Patient location: Home  I connected with Connye Burkitt  on 12/09/20 at 11:15 AM EDT by Mychart Video Encounter and verified that I am speaking with the correct person using two identifiers.       I discussed the limitations, risks, security and privacy concerns of performing an evaluation and management service virtually and the availability of in person appointments. I also discussed with the patient that there may be a patient responsible charge related to this service. The patient expressed understanding and agreed to proceed.  Post Partum Visit Note Subjective:   Stephanie Velazquez is a 20 y.o. G25P1001 female who presents for a postpartum visit. She is 5 weeks postpartum following a vaginal delivery.  I have fully reviewed the prenatal and intrapartum course. The delivery was at 37 gestational weeks. Had preeclampsia without severe features.  Anesthesia: epidural.  Baby is doing well. Baby is feeding by Breast / Pumping /Bottle.  She reports having some fevers, chills, redness and hardness around breast, and reduced output.  Concerned about mastitis. Unable to come in for exam due to childcare and transportation issues.   Bleeding no bleeding. Bowel function is normal. Bladder function is normal. Patient is not sexually active. Contraception method is none. Postpartum depression screening: negative.   The pregnancy intention screening data noted above was reviewed. Potential methods of contraception were discussed. The patient elected to proceed with progestin only pills for now.   Edinburgh Postnatal Depression Scale - 12/09/20 1116       Edinburgh Postnatal Depression Scale:  In the Past 7 Days   I have been able to laugh and see the funny side of things. 0    I have looked forward with enjoyment to things. 0    I have blamed myself unnecessarily when things went wrong. 0    I have been anxious or worried for no good  reason. 0    I have felt scared or panicky for no good reason. 0    Things have been getting on top of me. 0    I have been so unhappy that I have had difficulty sleeping. 0    I have felt sad or miserable. 0    I have been so unhappy that I have been crying. 0    The thought of harming myself has occurred to me. 0    Edinburgh Postnatal Depression Scale Total 0            The following portions of the patient's history were reviewed and updated as appropriate: allergies, current medications, past family history, past medical history, past social history, past surgical history, and problem list.  Review of Systems Pertinent items noted in HPI and remainder of comprehensive ROS otherwise negative.  Objective:  BP 127/78 as per patient   General:  Alert, oriented and cooperative. Patient is in no acute distress.  Respiratory: Normal respiratory effort, no problems with respiration noted  Mental Status: Normal mood and affect. Normal behavior. Normal judgment and thought content.  Rest of physical exam deferred due to type of encounter   Assessment:    Abnormal postpartum exam.  Plan:  Essential components of care per ACOG recommendations:  1.  Mood and well being: Patient with negative depression screening today. Reviewed local resources for support.  - Patient does not use tobacco - hx of drug use? No    2. Infant care and feeding:  -  Patient currently breastmilk feeding? Yes . If breastmilk feeding discussed return to work and pumping. If needed, patient was provided letter for work to allow for every 2-3 hr pumping breaks, and to be granted a private location to express breastmilk and refrigerated area to store breastmilk. Reviewed importance of draining breast regularly to support lactation. -Social determinants of health (SDOH) reviewed in EPIC. No concerns.  3. Sexuality, contraception and birth spacing - Patient does not want a pregnancy in the next year.  Desired family  size is undecided.  - Reviewed forms of contraception in tiered fashion. Patient desired oral progesterone-only contraceptive today.  Micronor prescribed. - Discussed birth spacing of 18 months  4. Sleep and fatigue -Encouraged family/partner/community support of 4 hrs of uninterrupted sleep to help with mood and fatigue  5. Physical Recovery  - Discussed patients delivery. - Patient had a first degree laceration, perineal healing reviewed. Patient expressed understanding - Patient has urinary incontinence? No - Patient is safe to resume physical and sexual activity  6.  Health Maintenance - Last pap smear done 05/07/2020 and was normal   7. Mastitis - cefadroxil (DURICEF) 500 MG capsule; Take 1 capsule (500 mg total) by mouth 2 (two) times daily for 10 days.  Dispense: 20 capsule; Refill: 0 - ibuprofen (ADVIL) 800 MG tablet; Take 1 tablet (800 mg total) by mouth every 8 (eight) hours as needed for fever, headache or moderate pain.  Dispense: 30 tablet; Refill: 2 Recommended to take medication as prescribed, compresses to breast, continued pumping Told to come in for worsening or concerning symptoms. Follow up in one week.  8. Preeclampsia - Normal BP, will stop antihypertensive medication for now - Will continue to monitor BP as needed  I provided 20 minutes of face-to-face time during this encounter.   Return in about 1 week (around 12/16/2020) for Virtual Follow Up Mastitis.   Jaynie Collins, MD [facility] [employer], [facility]

## 2020-12-13 ENCOUNTER — Other Ambulatory Visit: Payer: Self-pay | Admitting: Family Medicine

## 2020-12-18 ENCOUNTER — Encounter: Payer: Self-pay | Admitting: Obstetrics and Gynecology

## 2020-12-18 ENCOUNTER — Telehealth (INDEPENDENT_AMBULATORY_CARE_PROVIDER_SITE_OTHER): Payer: Medicaid Other | Admitting: Obstetrics and Gynecology

## 2020-12-18 ENCOUNTER — Other Ambulatory Visit: Payer: Self-pay

## 2020-12-18 DIAGNOSIS — Z87898 Personal history of other specified conditions: Secondary | ICD-10-CM

## 2020-12-18 NOTE — Progress Notes (Signed)
     TELEHEALTH GYNECOLOGY VISIT ENCOUNTER NOTE  Provider location: [facility] at [facility]   Patient location: Home  I connected with Stephanie Velazquez on 12/18/20 at  3:30 PM EDT by telephone and verified that I am speaking with the correct person using two identifiers. Patient was unable to do MyChart audiovisual encounter due to technical difficulties, she tried several times.    I discussed the limitations, risks, security and privacy concerns of performing an evaluation and management service by telephone and the availability of in person appointments. I also discussed with the patient that there may be a patient responsible charge related to this service. The patient expressed understanding and agreed to proceed.   History:  Stephanie Velazquez is a 20 y.o. G74P1001 female being evaluated today for f/u mastitis.  She was seen for her PP visit on 10/18 and set up for her visit today.  No mastitis issues (pt had 10/18 virtual visit for this) and has been off her BP meds since then.    Past Medical History:  Diagnosis Date   Anxiety    Depression    Just started back on meds   Headache    Infection    UTI   Pregnancy induced hypertension    Past Surgical History:  Procedure Laterality Date   WISDOM TOOTH EXTRACTION  04/02/2016   The following portions of the patient's history were reviewed and updated as appropriate: allergies, current medications, past family history, past medical history, past social history, past surgical history and problem list.    Review of Systems:  Pertinent items noted in HPI and remainder of comprehensive ROS otherwise negative.  Physical Exam:   General:  Alert, oriented and cooperative.   Mental Status: Normal mood and affect perceived. Normal judgment and thought content.  Physical exam deferred due to nature of the encounter  Labs and Imaging No results found for this or any previous visit (from the past 336 hour(s)). No  results found.    Assessment and Plan:     1. History of mastitis No issues  2. Postpartum Patient would like to do OCPs and she was on combined OCPs in the past and like to do those instead of POPs. Patient to come in for BP check and if normal then can send in combined OCPs. Pt to see if she can find out name of OCP but she was on Ortho cyclen here many years ago.   Patient also told that she can drop off urine then too   Okay to resume sexual intercourse and pt told to let us know any issues   I discussed the assessment and treatment plan with the patient. The patient was provided an opportunity to ask questions and all were answered. The patient agreed with the plan and demonstrated an understanding of the instructions.   The patient was advised to call back or seek an in-person evaluation/go to the ED if the symptoms worsen or if the condition fails to improve as anticipated.  I provided 15 minutes of non-face-to-face time during this encounter.   Weston Bing, MD [facility] [employer], [facility]

## 2020-12-18 NOTE — Progress Notes (Signed)
Virtual visit to f/u on mastitis.  Patient states she is feeling much better no discomfort at all.

## 2020-12-23 ENCOUNTER — Other Ambulatory Visit: Payer: Self-pay | Admitting: Obstetrics and Gynecology

## 2020-12-23 MED ORDER — NORETHIN ACE-ETH ESTRAD-FE 1-20 MG-MCG PO TABS: 1.0000 | ORAL_TABLET | Freq: Every day | ORAL | 3 refills | Status: DC

## 2021-01-04 ENCOUNTER — Other Ambulatory Visit: Payer: Self-pay | Admitting: Family Medicine

## 2021-01-04 MED ORDER — LISINOPRIL 10 MG PO TABS: 10.0000 mg | ORAL_TABLET | Freq: Every day | ORAL | 1 refills | Status: DC

## 2021-01-19 ENCOUNTER — Telehealth: Payer: Self-pay | Admitting: *Deleted

## 2021-01-19 MED ORDER — CEFADROXIL 500 MG PO CAPS: 500.0000 mg | ORAL_CAPSULE | Freq: Two times a day (BID) | ORAL | 0 refills | Status: DC

## 2021-01-19 NOTE — Telephone Encounter (Signed)
Pt called stating she has a UTI and would like to have something sent in, as she has a history of UTI's. Okay per Dr Shawnie Pons to send in medication.

## 2021-01-24 ENCOUNTER — Ambulatory Visit
Admission: EM | Admit: 2021-01-24 | Discharge: 2021-01-24 | Discharge status: Against Medical Advice | Payer: Medicaid Other

## 2021-01-24 ENCOUNTER — Other Ambulatory Visit: Payer: Self-pay

## 2021-01-24 NOTE — ED Notes (Signed)
Contacted pt via number on file x3 without response also called name in lobby

## 2021-04-13 ENCOUNTER — Ambulatory Visit: Payer: Medicaid Other

## 2021-04-14 ENCOUNTER — Ambulatory Visit (INDEPENDENT_AMBULATORY_CARE_PROVIDER_SITE_OTHER): Payer: Medicaid Other | Admitting: *Deleted

## 2021-04-14 ENCOUNTER — Other Ambulatory Visit: Payer: Self-pay

## 2021-04-14 ENCOUNTER — Other Ambulatory Visit (HOSPITAL_COMMUNITY)
Admission: RE | Admit: 2021-04-14 | Discharge: 2021-04-14 | Disposition: A | Discharge status: Home / Self Care | Payer: Medicaid Other | Source: Ambulatory Visit | Attending: Obstetrics & Gynecology | Admitting: Obstetrics & Gynecology

## 2021-04-14 VITALS — BP 145/90 | HR 65

## 2021-04-14 DIAGNOSIS — N912 Amenorrhea, unspecified: Secondary | ICD-10-CM | POA: Diagnosis not present

## 2021-04-14 DIAGNOSIS — N898 Other specified noninflammatory disorders of vagina: Secondary | ICD-10-CM | POA: Diagnosis not present

## 2021-04-14 DIAGNOSIS — R3915 Urgency of urination: Secondary | ICD-10-CM

## 2021-04-14 LAB — POCT URINALYSIS DIPSTICK
Blood, UA: NEGATIVE
Leukocytes, UA: NEGATIVE

## 2021-04-14 LAB — POCT URINE PREGNANCY: Preg Test, Ur: NEGATIVE

## 2021-04-14 MED ORDER — SLYND 4 MG PO TABS: 4.0000 mg | ORAL_TABLET | Freq: Every day | ORAL | 11 refills | Status: DC

## 2021-04-14 NOTE — Progress Notes (Addendum)
SUBJECTIVE:  21 y.o. female complains of abnormal vaginal discharge for few day(s) and urinary urgency Denies abnormal vaginal bleeding or significant pelvic pain or fever.Denies history of known exposure to STD.  No LMP recorded. Pt requested a UPT   OBJECTIVE:  She appears alert, well appearing, in no apparent distress Urine dipstick: negative for all components. UPT-negative  ASSESSMENT:  Vaginal Discharge  Dysuria    PLAN:  BVAG, CVAG probe and urine culture sent to lab. Treatment: To be determined once lab results are received ROV prn if symptoms persist or worsen.   Pt would also like to switch her OCP's. Pt had elevated BP in office today as well. Discussed with Dr Harolyn Rutherford and will change to [facility] and pt to follow up with PCP for BP.

## 2021-04-16 ENCOUNTER — Other Ambulatory Visit: Payer: Self-pay | Admitting: *Deleted

## 2021-04-16 LAB — CERVICOVAGINAL ANCILLARY ONLY
Bacterial Vaginitis (gardnerella): NEGATIVE
Candida Glabrata: NEGATIVE
Candida Vaginitis: POSITIVE — AB
Comment: NEGATIVE
Comment: NEGATIVE
Comment: NEGATIVE

## 2021-04-16 MED ORDER — FLUCONAZOLE 150 MG PO TABS: 150.0000 mg | ORAL_TABLET | Freq: Once | ORAL | 3 refills | Status: AC

## 2021-04-16 NOTE — Progress Notes (Signed)
erroneous

## 2021-04-17 LAB — URINE CULTURE: Organism ID, Bacteria: NO GROWTH

## 2021-06-05 IMAGING — US US RENAL
1 series · 15 of 25 positions shown · non-contrast
Comparison: None.

CLINICAL DATA: Pyelonephritis. Pregnant patient at 17 weeks
gestation.

EXAM:
RENAL / URINARY TRACT ULTRASOUND COMPLETE

[Series 1: us renal · 15 of 38 slices shown]
[im 1/38]
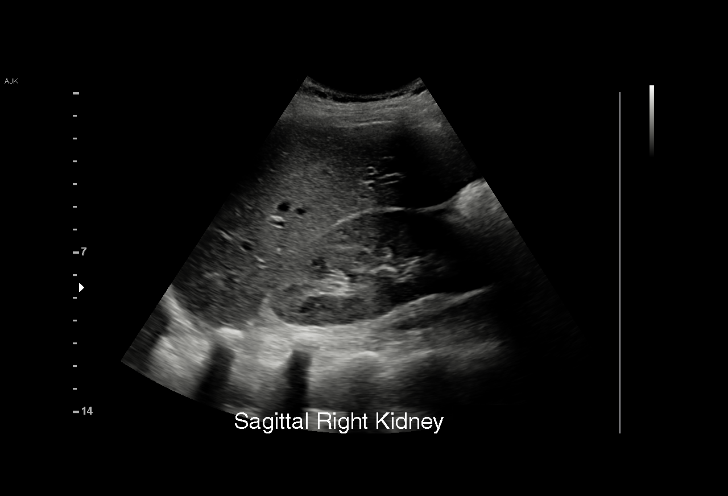
[im 4/38]
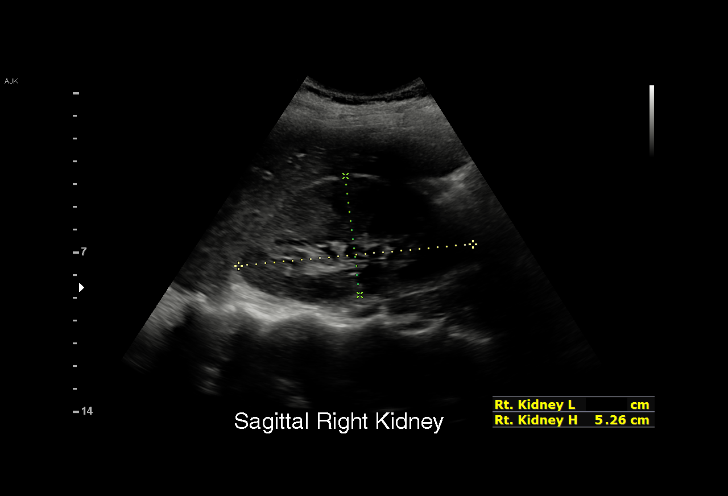
[im 7/38]
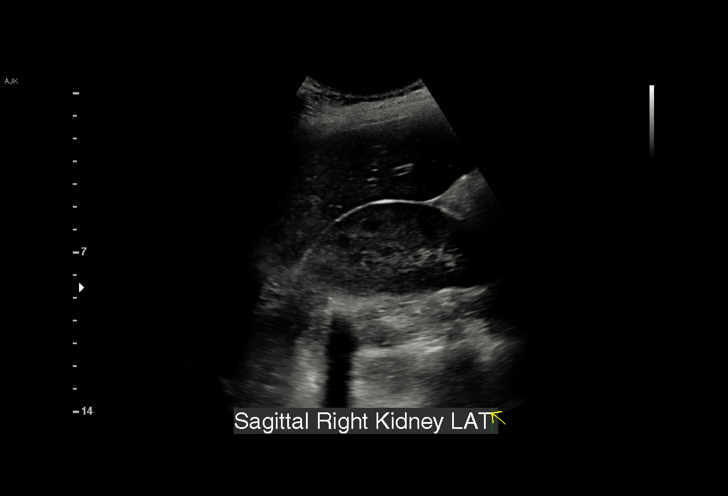
[im 8/38]
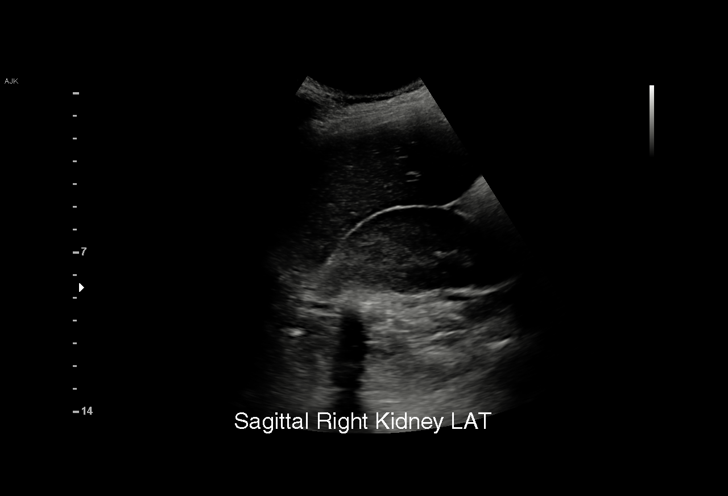
[im 11/38]
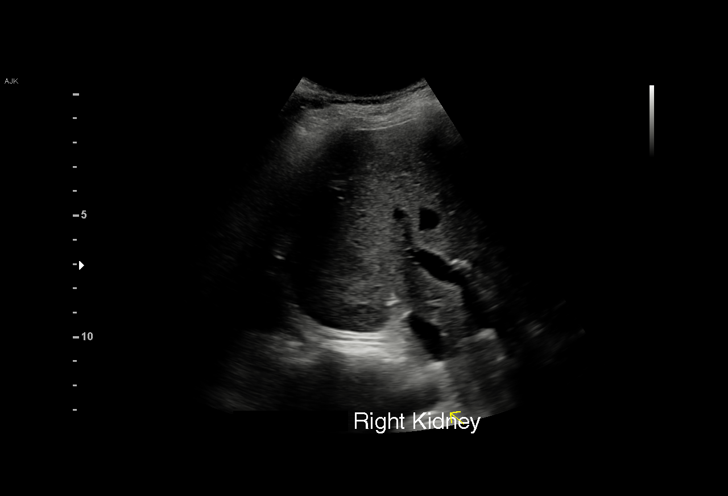
[im 14/38]
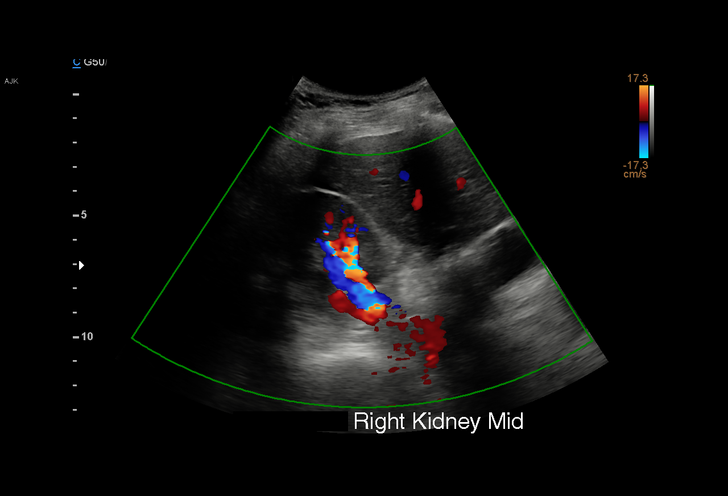
[im 16/38]
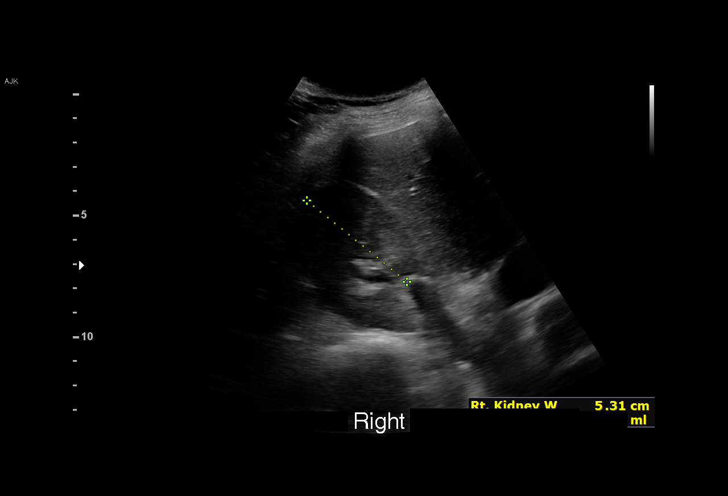
[im 19/38]
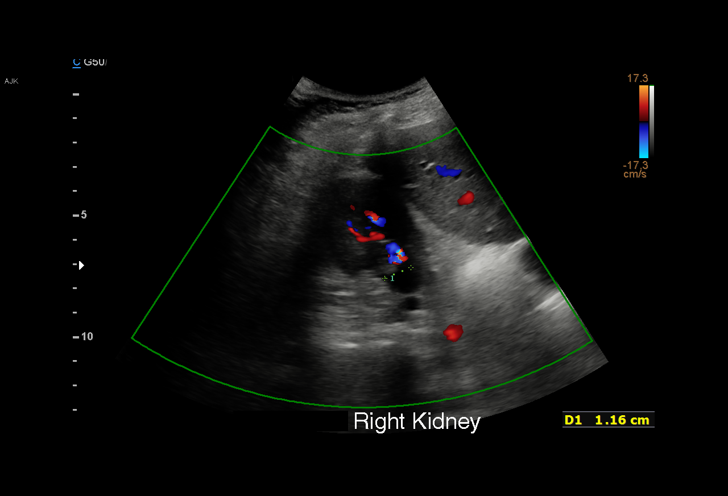
[im 22/38]
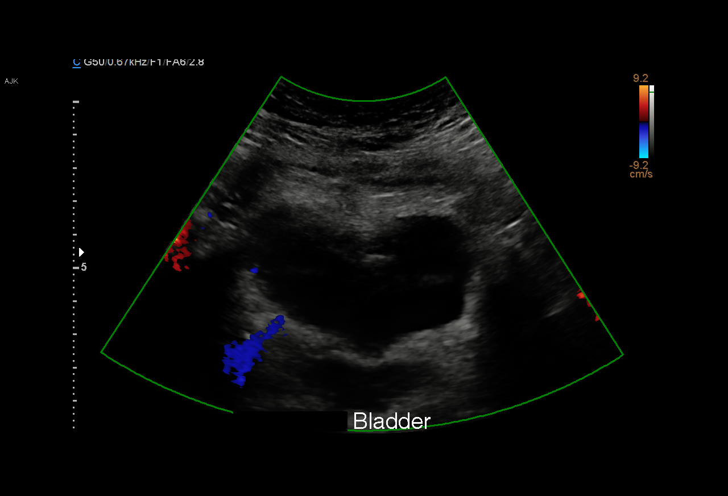
[im 24/38]
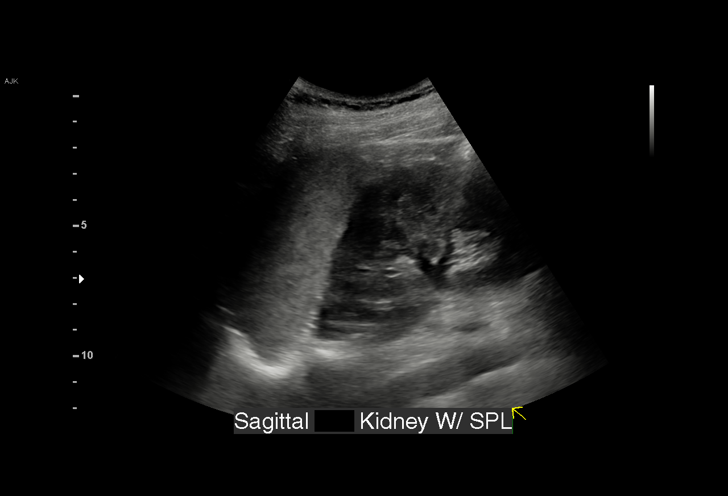
[im 27/38]
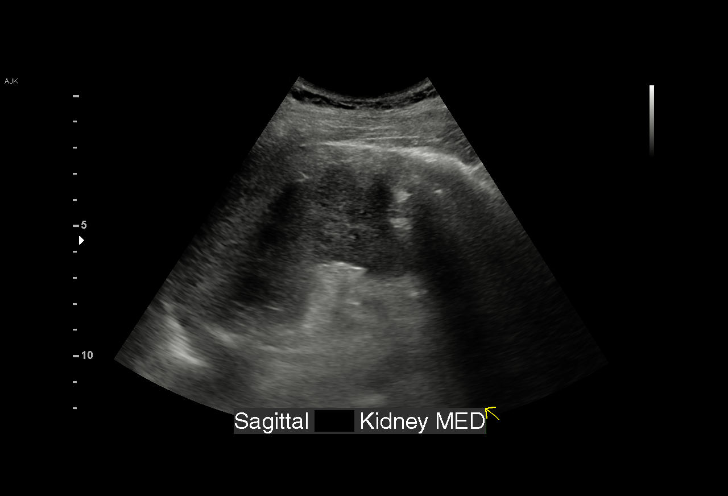
[im 30/38]
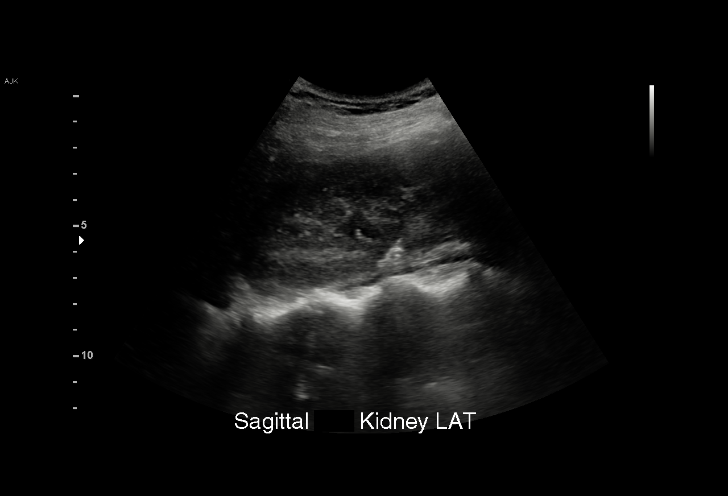
[im 31/38]
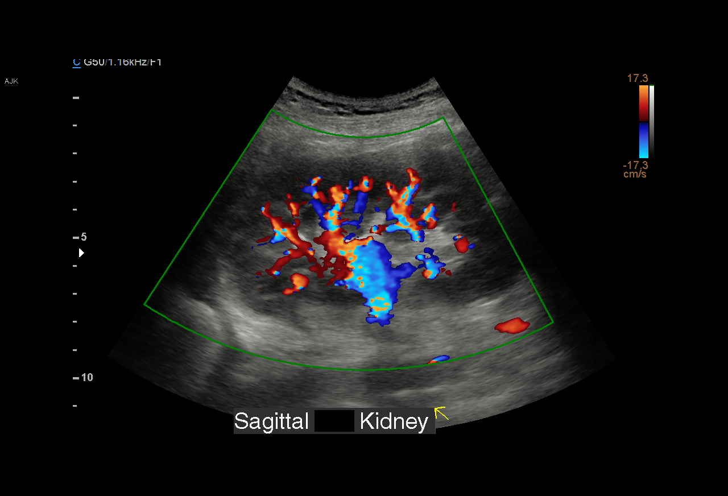
[im 34/38]
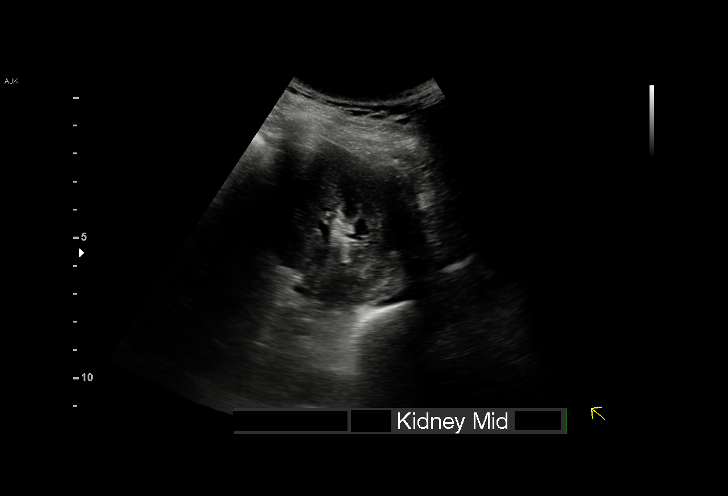
[im 38/38]
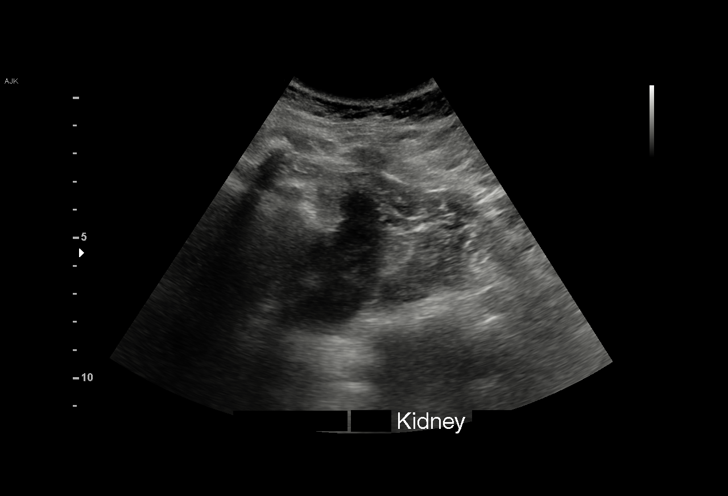

[15 of 25 positions shown; findings below may reference images not displayed]

FINDINGS: Right Kidney:

Renal measurements: 10.3 x 5.3 x 5.3 cm = volume: 151 mL. Minimal
hydronephrosis. No focal fluid collection, renal lesion or stone.
Normal renal parenchymal echogenicity.

Left Kidney:

Renal measurements: 10.5 x 5.9 x 4.6 cm = volume: 150 mL. No
hydronephrosis. Normal renal parenchymal echogenicity. No focal
lesion or stone. No fluid collection.

Bladder:

Appears normal for degree of bladder distention.

Other:

Gravid uterus partially visualized.
IMPRESSION: 1. Minimal right hydronephrosis.
2. No renal fluid collection, focal abnormality, or stone.

## 2021-06-27 IMAGING — US US MFM OB COMP +14 WKS
1 series · 13 of 28 positions shown · non-contrast
Comparison: none

[Series 1: us mfm ob comp +14 wks · 135 acquisitions, 13 frames shown]
[im 5/135]
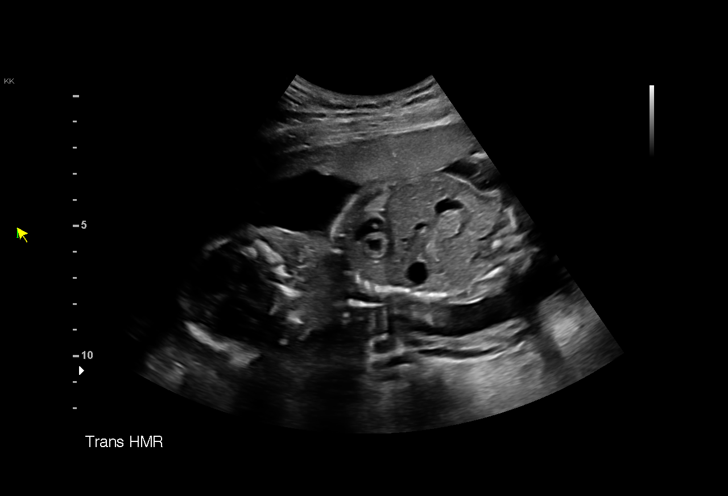
[im 15/135]
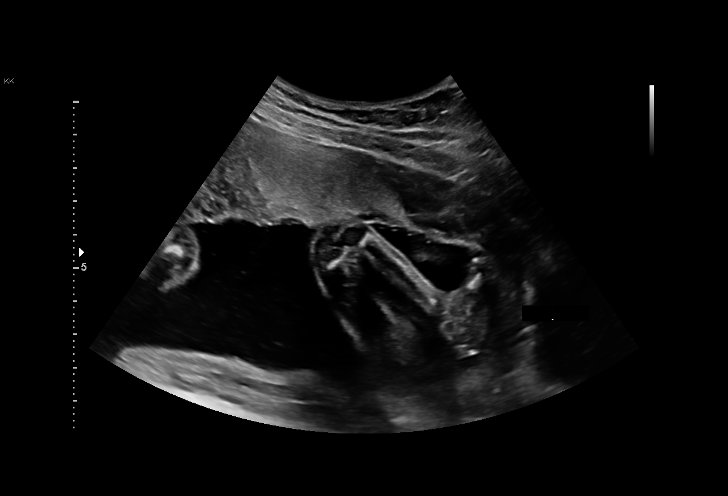
[im 25/135]
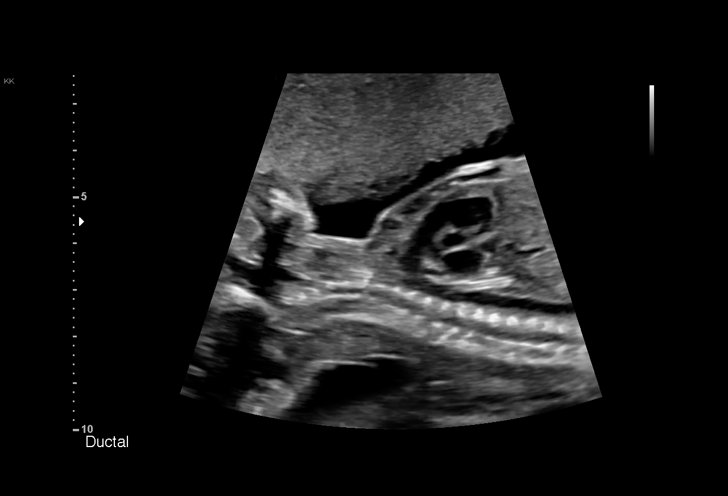
[im 35/135]
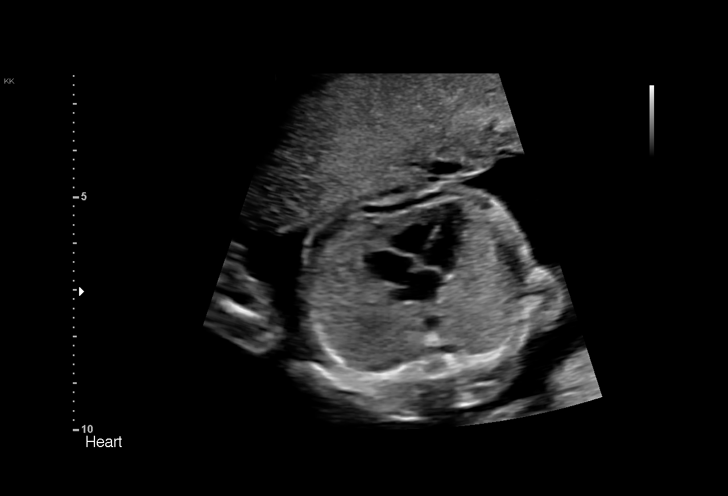
[im 45/135]
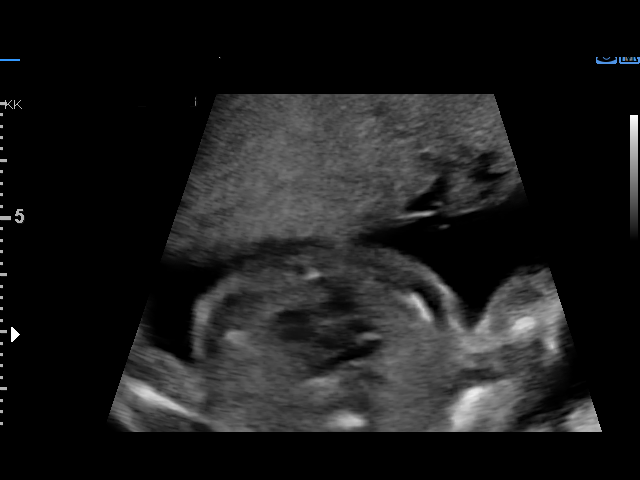
[im 55/135]
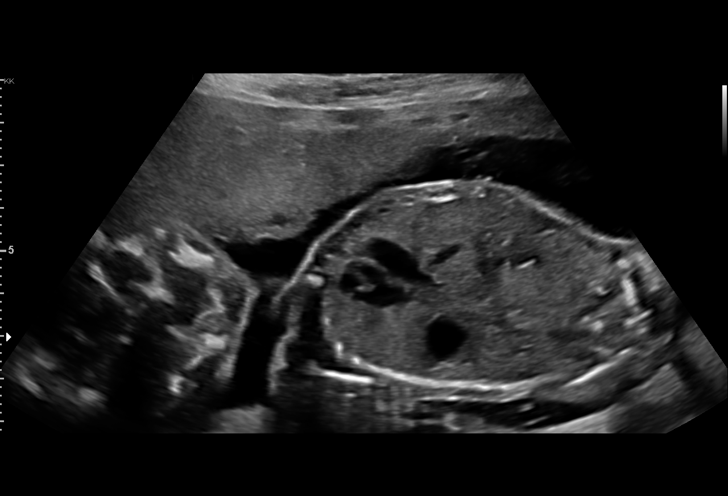
[im 70/135]
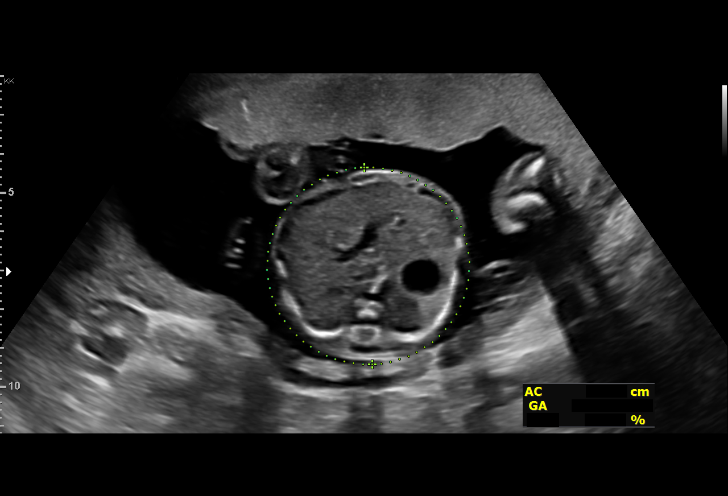
[im 80/135]
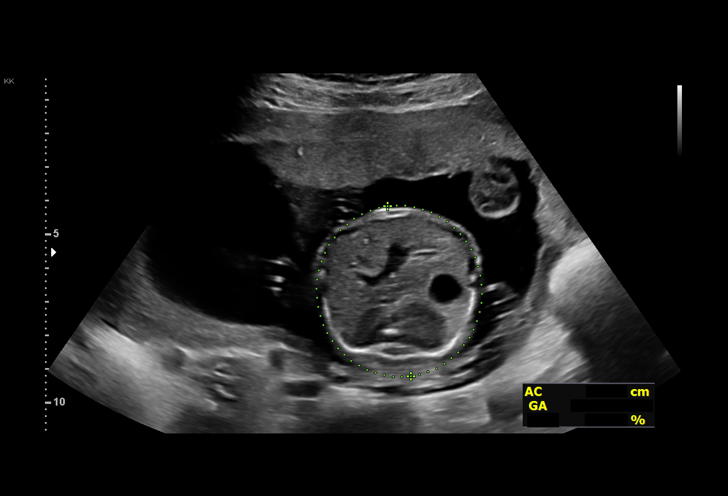
[im 90/135]
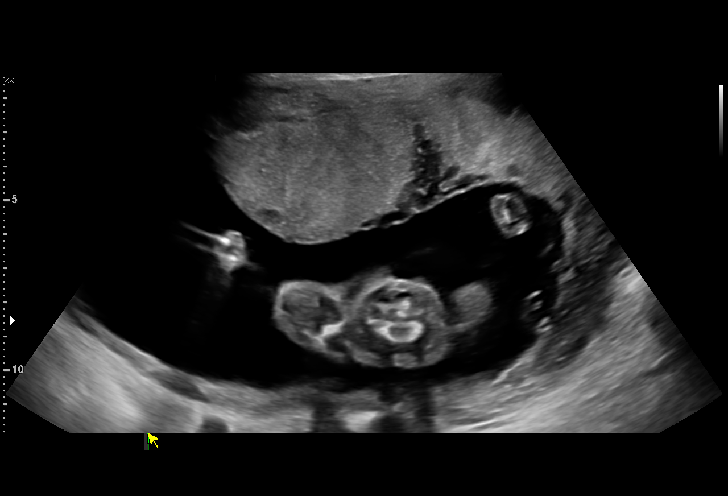
[im 100/135]
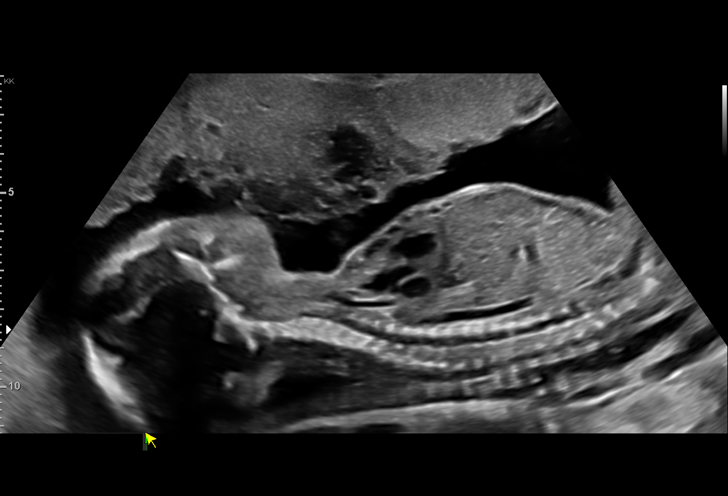
[im 110/135]
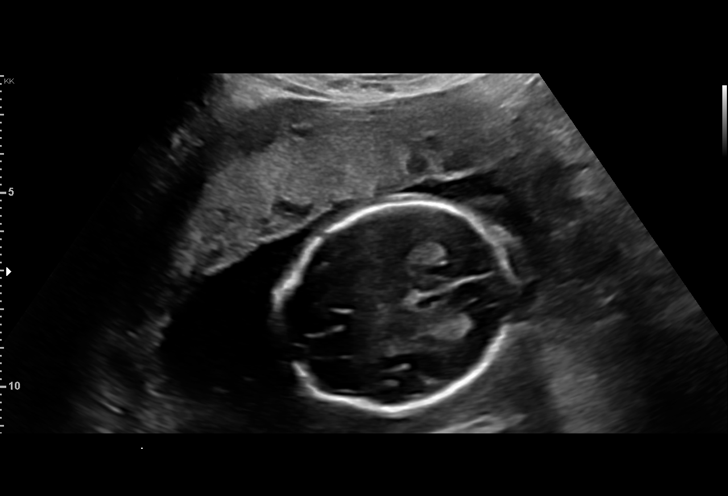
[im 120/135]
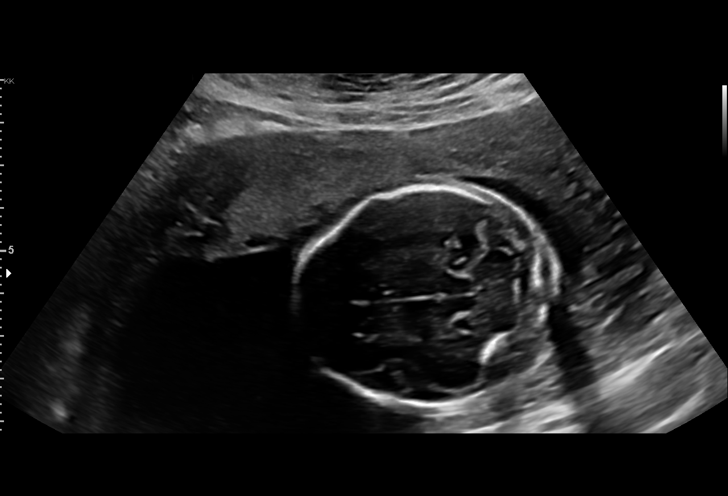
[im 130/135]
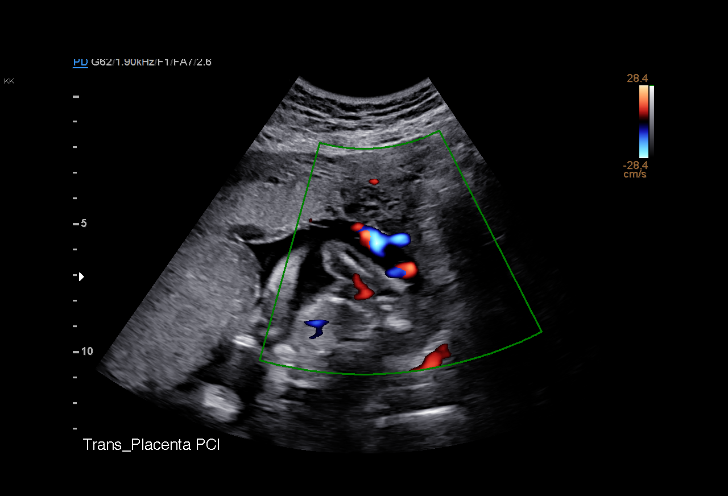

[13 of 28 positions shown; findings below may reference images not displayed]

[facility]                                 [HOSPITAL] at

                                                      [facility]

Indications

 20 weeks gestation of pregnancy
 Encounter for antenatal screening for
 malformations
 Low Risk NIPS(Negative AFP)(Negative
 Horizon [DATE])
Fetal Evaluation

 Num Of Fetuses:         1
 Fetal Heart Rate(bpm):  144
 Cardiac Activity:       Observed
 Presentation:           Cephalic
 Placenta:               Anterior
 P. Cord Insertion:      Not well visualized

 Amniotic Fluid
 AFI FV:      Within normal limits

                             Largest Pocket(cm)

Biometry

 BPD:      52.2  mm     G. Age:  21w 6d         96  %    CI:        77.43   %    70 - 86
                                                         FL/HC:      18.4   %    16.8 -
 HC:      187.8  mm     G. Age:  21w 1d         82  %    HC/AC:      1.17        1.09 -
 AC:       160   mm     G. Age:  21w 1d         75  %    FL/BPD:     66.3   %
 FL:       34.6  mm     G. Age:  20w 6d         69  %    FL/AC:      21.6   %    20 - 24
 CER:      21.8  mm     G. Age:  20w 4d         84  %
 NFT:       3.4  mm
 LV:        6.7  mm
 CM:        3.7  mm

 Est. FW:     396  gm    0 lb 14 oz      90  %
OB History

 Gravidity:    1
Gestational Age

 U/S Today:     21w 2d                                        EDD:   11/16/20
 Best:          20w 1d     Det. By:  Early Ultrasound         EDD:   11/24/20
                                     (04/08/20)
Anatomy

 Cranium:               Appears normal         LVOT:                   Appears normal
 Cavum:                 Appears normal         Aortic Arch:            Appears normal
 Ventricles:            Appears normal         Ductal Arch:            Appears normal
 Choroid Plexus:        Appears normal         Diaphragm:              Appears normal
 Cerebellum:            Appears normal         Stomach:                Appears normal, left
                                                                       sided
 Posterior Fossa:       Appears normal         Abdomen:                Appears normal
 Nuchal Fold:           Appears normal         Abdominal Wall:         Appears nml (cord
                                                                       insert, abd wall)
 Face:                  Appears normal         Cord Vessels:           Appears normal (3
                        (orbits and profile)                           vessel cord)
 Lips:                  Appears normal         Kidneys:                Appear normal
 Palate:                Appears normal         Bladder:                Appears normal
 Thoracic:              Appears normal         Spine:                  Limited views
                                                                       appear normal
 Heart:                 Appears normal         Upper Extremities:      Appears normal
                        (4CH, axis, and
                        situs)
 RVOT:                  Appears normal         Lower Extremities:      Appears normal

 Other:  Fetus appears to be female. Heels visualized. Technically difficult due
         to fetal position. Hands not well visualized.
Cervix Uterus Adnexa

 Cervix
 Length:           3.77  cm.
 Normal appearance by transabdominal scan.

 Adnexa
 No abnormality visualized.
Impression

 G1 P0. Patient is here for fetal anatomy scan.
 On cell-free fetal DNA screening, the risks of fetal
 aneuploidies are not increased .MSAFP screening showed
 low risk for open-neural tube defects .

 We performed fetal anatomy scan. No makers of
 aneuploidies or fetal structural defects are seen. Fetal
 biometry is consistent with her previously-established dates.
 Amniotic fluid is normal and good fetal activity is seen.
 Patient understands the limitations of ultrasound in detecting
 fetal anomalies.
Recommendations

 -An appointment was made for her to return in 4 weeks for
 completion of fetal anatomy (fetal spine and PCI).
                 Saldar[place]aga, [place]

## 2022-01-21 ENCOUNTER — Ambulatory Visit (INDEPENDENT_AMBULATORY_CARE_PROVIDER_SITE_OTHER): Payer: Self-pay | Admitting: *Deleted

## 2022-01-21 VITALS — BP 129/80 | HR 69

## 2022-01-21 DIAGNOSIS — R350 Frequency of micturition: Secondary | ICD-10-CM

## 2022-01-21 DIAGNOSIS — R3915 Urgency of urination: Secondary | ICD-10-CM

## 2022-01-21 LAB — POCT URINALYSIS DIPSTICK

## 2022-01-21 MED ORDER — NITROFURANTOIN MONOHYD MACRO 100 MG PO CAPS: 100.0000 mg | ORAL_CAPSULE | Freq: Two times a day (BID) | ORAL | 0 refills | Status: DC

## 2022-01-21 NOTE — Progress Notes (Signed)
SUBJECTIVE: Stephanie Velazquez is a 21 y.o. female who complains of urinary frequency, urgency and dysuria x 5 days, without flank pain, fever, chills, or abnormal vaginal discharge or bleeding.   OBJECTIVE: Appears well, in no apparent distress.  Vital signs are normal. Urine dipstick shows positive for RBC's and positive for leukocytes.    ASSESSMENT: Urinary frequency and urgency   PLAN: Treatment per orders.  Call or return to clinic prn if these symptoms worsen or fail to improve as anticipated.   Scheryl Marten, RN

## 2022-01-21 NOTE — Progress Notes (Signed)
ATTESTATION OF SUPERVISION OF RN: Evaluation and management procedures were performed by the RN under my supervision and collaboration. I have reviewed the nursing note and chart and agree with the management and plan for this patient.  Lizmary Nader, CNM  

## 2022-01-24 LAB — URINE CULTURE

## 2022-03-23 DIAGNOSIS — R509 Fever, unspecified: Secondary | ICD-10-CM | POA: Diagnosis not present

## 2022-03-23 DIAGNOSIS — J22 Unspecified acute lower respiratory infection: Secondary | ICD-10-CM | POA: Diagnosis not present

## 2022-03-23 DIAGNOSIS — H6502 Acute serous otitis media, left ear: Secondary | ICD-10-CM | POA: Diagnosis not present

## 2022-03-23 DIAGNOSIS — Z6821 Body mass index (BMI) 21.0-21.9, adult: Secondary | ICD-10-CM | POA: Diagnosis not present

## 2022-04-30 DIAGNOSIS — K219 Gastro-esophageal reflux disease without esophagitis: Secondary | ICD-10-CM | POA: Diagnosis not present

## 2022-04-30 DIAGNOSIS — E538 Deficiency of other specified B group vitamins: Secondary | ICD-10-CM | POA: Diagnosis not present

## 2022-04-30 DIAGNOSIS — E611 Iron deficiency: Secondary | ICD-10-CM | POA: Diagnosis not present

## 2022-04-30 DIAGNOSIS — F411 Generalized anxiety disorder: Secondary | ICD-10-CM | POA: Diagnosis not present

## 2022-04-30 DIAGNOSIS — R61 Generalized hyperhidrosis: Secondary | ICD-10-CM | POA: Diagnosis not present

## 2022-08-06 DIAGNOSIS — E538 Deficiency of other specified B group vitamins: Secondary | ICD-10-CM | POA: Diagnosis not present

## 2022-08-06 DIAGNOSIS — Z1339 Encounter for screening examination for other mental health and behavioral disorders: Secondary | ICD-10-CM | POA: Diagnosis not present

## 2022-08-06 DIAGNOSIS — Z1331 Encounter for screening for depression: Secondary | ICD-10-CM | POA: Diagnosis not present

## 2022-08-06 DIAGNOSIS — F411 Generalized anxiety disorder: Secondary | ICD-10-CM | POA: Diagnosis not present

## 2022-09-02 ENCOUNTER — Ambulatory Visit: Payer: Self-pay | Admitting: Obstetrics & Gynecology

## 2022-09-02 ENCOUNTER — Encounter: Payer: Self-pay | Admitting: Obstetrics & Gynecology

## 2022-09-02 VITALS — BP 120/81 | HR 73 | Wt 147.0 lb

## 2022-09-02 DIAGNOSIS — Z3202 Encounter for pregnancy test, result negative: Secondary | ICD-10-CM | POA: Diagnosis not present

## 2022-09-02 DIAGNOSIS — Z30430 Encounter for insertion of intrauterine contraceptive device: Secondary | ICD-10-CM | POA: Diagnosis not present

## 2022-09-02 LAB — POCT URINE PREGNANCY: Preg Test, Ur: NEGATIVE

## 2022-09-02 MED ORDER — LEVONORGESTREL 19.5 MG IU IUD: INTRAUTERINE_SYSTEM | Freq: Once | INTRAUTERINE | Status: AC

## 2022-09-02 NOTE — Patient Instructions (Signed)
IUD PLACEMENT POST-PROCEDURE INSTRUCTIONS  You may take Ibuprofen, Aleve or Tylenol for pain if needed.  Cramping should resolve within in 24 hours.  You may have a small amount of spotting.  You should wear a mini pad for the next few days.  You may have intercourse after 24 hours.  Use back up for the first week.  You need to call if you have any pelvic pain, fever, heavy bleeding or foul smelling vaginal discharge.  Irregular bleeding is common the first several months after having an IUD placed. You do not need to call for this reason unless you are concerned.  Shower or bathe as normal  You should have a follow-up appointment in 4 weeks for a re-check to make sure you are not having any problems.

## 2022-09-02 NOTE — Progress Notes (Signed)
   GYNECOLOGY OFFICE PROCEDURE NOTE  Stephanie Velazquez is a 22 y.o. G1P1001 here for progestin  IUD insertion. No GYN concerns.  Last pap smear was on 05/07/2020 and was normal.  Counseled about Mirena vs Kyleena, she wanted less hormones.  Desires Kyleena. She had unprotected intercourse on Saturday, but is due for her period in 1-2 days.  Advised about small risk of pregnancy given history of irregular periods.   IUD Insertion Procedure Note Patient identified, informed consent performed, consent signed.   Discussed risks of irregular bleeding, cramping, infection, malpositioning or misplacement of the IUD outside the uterus which may require further procedure such as laparoscopy. Also discussed >99% contraception efficacy, increased risk of ectopic pregnancy with failure of method.   Emphasized that this did not protect against STIs, condoms recommended during all sexual encounters. Time out was performed.  Chaperone present.  Urine pregnancy test negative.  Speculum placed in the vagina.  Cervix visualized.  Cleaned with Betadine x 2.  Uterus sounded to 8 cm.  Kyleena IUD placed per manufacturer's recommendations.  Strings trimmed to 3 cm. Tenaculum was removed, good hemostasis noted.  Patient tolerated procedure well.   Patient was given post-procedure instructions.  She was advised to have backup contraception for one week.  Patient will follow up in 4 weeks for IUD check.   Jaynie Collins, MD, FACOG Obstetrician & Gynecologist, [facility] for [employer], [facility]

## 2022-09-30 ENCOUNTER — Ambulatory Visit: Payer: BC Managed Care – PPO | Admitting: Obstetrics and Gynecology

## 2022-11-11 ENCOUNTER — Other Ambulatory Visit (HOSPITAL_COMMUNITY)
Admission: RE | Admit: 2022-11-11 | Discharge: 2022-11-11 | Disposition: A | Discharge status: Home / Self Care | Payer: BC Managed Care – PPO | Source: Ambulatory Visit | Attending: Obstetrics and Gynecology | Admitting: Obstetrics and Gynecology

## 2022-11-11 ENCOUNTER — Ambulatory Visit: Payer: BC Managed Care – PPO | Admitting: Obstetrics and Gynecology

## 2022-11-11 VITALS — BP 134/89 | HR 73 | Wt 152.0 lb

## 2022-11-11 DIAGNOSIS — N898 Other specified noninflammatory disorders of vagina: Secondary | ICD-10-CM

## 2022-11-11 DIAGNOSIS — Z30431 Encounter for routine checking of intrauterine contraceptive device: Secondary | ICD-10-CM

## 2022-11-11 NOTE — Progress Notes (Signed)
RGYN here to F/U on IUD.   Notes still having spotting like as if on the end of cycle.  Had episodes of painful cramps one episode lasted 3 days.

## 2022-11-15 ENCOUNTER — Encounter: Payer: Self-pay | Admitting: Obstetrics and Gynecology

## 2022-11-15 LAB — CERVICOVAGINAL ANCILLARY ONLY
Bacterial Vaginitis (gardnerella): NEGATIVE
Candida Glabrata: NEGATIVE
Candida Vaginitis: NEGATIVE
Chlamydia: NEGATIVE
Comment: NEGATIVE
Comment: NEGATIVE
Comment: NEGATIVE
Comment: NEGATIVE
Comment: NEGATIVE
Comment: NORMAL
Neisseria Gonorrhea: NEGATIVE
Trichomonas: NEGATIVE

## 2022-11-16 NOTE — Progress Notes (Addendum)
Obstetrics and Gynecology Visit Return Patient Evaluation  Appointment Date: 11/11/2022  Primary Care Provider: Lise Auer  OBGYN Clinic: Center for [facility]  Chief Complaint: IUD check up  History of Present Illness:  Stephanie Velazquez is a 22 y.o. s/p 7/11 [facility] IUD placement with Dr. Macon Large. Patient with spotting and tan d/c since IUD placement and also with on and off cramping for past two weeks  Review of Systems: as noted in the History of Present Illness.   Medications:  Stephanie Velazquez had no medications administered during this visit. Current Outpatient Medications  Medication Sig Dispense Refill   cyanocobalamin (VITAMIN B12) 1000 MCG/ML injection Inject into the muscle.     levonorgestrel (KYLEENA) 19.5 MG IUD 1 each by Intrauterine route once.     venlafaxine XR (EFFEXOR-XR) 75 MG 24 hr capsule Take 225 mg by mouth every morning.     No current facility-administered medications for this visit.    Allergies: is allergic to flagyl [metronidazole] and monistat [miconazole].  Physical Exam:  BP 134/89   Pulse 73   Wt 152 lb (68.9 kg)   BMI 23.11 kg/m  Body mass index is 23.11 kg/m. General appearance: Well nourished, well developed female in no acute distress.  Abdomen: diffusely non tender to palpation, non distended, and no masses, hernias Neuro/Psych:  Normal mood and affect.    Pelvic exam:  Cervical exam performed in the presence of a chaperone EGBUS: wnl Vaginal vault: scant tan d/c in vault and at external os Cervix:  IUD strings 3-4cm, no bleeding. +tan d/c Bimanual: deferred  Labs: UPT neg  Assessment: patient stable  Plan:  1. Vaginal discharge - Cervicovaginal ancillary only( [place])  2. IUD check up U/s vs removal offered to patient and she would like expectant management for now. If she changes her mind, pt to let us know on mychart.   Pt states she got the kyleena in order to have an IUD that is five years  in length    RTC: PRN  Cornelia Copa MD Attending Center for [employer] (Faculty Practice)  ADDENDUM FOR BILLING PURPOSES 01/01/2023 Patient presents two months s/p Kyleena IUD insertion with s/s of spotting, d/c and cramping.  Patient was sexually active prior to and after IUD insertion; no vaginal swab was performed at the IUD insertion to exclude infection and Kyleena contraindications for insertion are to exclude any active infection. Last swab was negativein September 2022 and she did have a BV and yeast swab only done in 03/2021 that was positive for yeast.  I asked the patient if I could obtain a swab "to exclude infection", and she was amenable to this. Cornelia Copa MD Attending Center for [employer] (Faculty Practice) 01/01/2023

## 2022-12-16 ENCOUNTER — Encounter: Payer: Self-pay | Admitting: Obstetrics and Gynecology

## 2023-01-11 DIAGNOSIS — F4322 Adjustment disorder with anxiety: Secondary | ICD-10-CM | POA: Diagnosis not present

## 2023-01-12 DIAGNOSIS — R509 Fever, unspecified: Secondary | ICD-10-CM | POA: Diagnosis not present

## 2023-01-12 DIAGNOSIS — Z6823 Body mass index (BMI) 23.0-23.9, adult: Secondary | ICD-10-CM | POA: Diagnosis not present

## 2023-01-18 DIAGNOSIS — F4322 Adjustment disorder with anxiety: Secondary | ICD-10-CM | POA: Diagnosis not present

## 2023-01-27 DIAGNOSIS — F4322 Adjustment disorder with anxiety: Secondary | ICD-10-CM | POA: Diagnosis not present

## 2023-01-28 DIAGNOSIS — E538 Deficiency of other specified B group vitamins: Secondary | ICD-10-CM | POA: Diagnosis not present

## 2023-01-28 DIAGNOSIS — G43909 Migraine, unspecified, not intractable, without status migrainosus: Secondary | ICD-10-CM | POA: Diagnosis not present

## 2023-01-28 DIAGNOSIS — R110 Nausea: Secondary | ICD-10-CM | POA: Diagnosis not present

## 2023-01-28 DIAGNOSIS — F411 Generalized anxiety disorder: Secondary | ICD-10-CM | POA: Diagnosis not present

## 2023-02-24 DIAGNOSIS — G43909 Migraine, unspecified, not intractable, without status migrainosus: Secondary | ICD-10-CM | POA: Diagnosis not present

## 2023-03-04 DIAGNOSIS — Z6823 Body mass index (BMI) 23.0-23.9, adult: Secondary | ICD-10-CM | POA: Diagnosis not present

## 2023-03-04 DIAGNOSIS — F411 Generalized anxiety disorder: Secondary | ICD-10-CM | POA: Diagnosis not present

## 2023-03-04 DIAGNOSIS — R42 Dizziness and giddiness: Secondary | ICD-10-CM | POA: Diagnosis not present

## 2023-03-04 DIAGNOSIS — K219 Gastro-esophageal reflux disease without esophagitis: Secondary | ICD-10-CM | POA: Diagnosis not present

## 2023-03-17 DIAGNOSIS — F4322 Adjustment disorder with anxiety: Secondary | ICD-10-CM | POA: Diagnosis not present

## 2023-04-28 DIAGNOSIS — F4322 Adjustment disorder with anxiety: Secondary | ICD-10-CM | POA: Diagnosis not present

## 2023-05-26 DIAGNOSIS — F4322 Adjustment disorder with anxiety: Secondary | ICD-10-CM | POA: Diagnosis not present

## 2023-08-05 DIAGNOSIS — L739 Follicular disorder, unspecified: Secondary | ICD-10-CM | POA: Diagnosis not present

## 2023-08-05 DIAGNOSIS — Z6824 Body mass index (BMI) 24.0-24.9, adult: Secondary | ICD-10-CM | POA: Diagnosis not present

## 2023-08-05 DIAGNOSIS — E538 Deficiency of other specified B group vitamins: Secondary | ICD-10-CM | POA: Diagnosis not present

## 2023-08-05 DIAGNOSIS — F411 Generalized anxiety disorder: Secondary | ICD-10-CM | POA: Diagnosis not present

## 2023-08-08 DIAGNOSIS — Z1331 Encounter for screening for depression: Secondary | ICD-10-CM | POA: Diagnosis not present

## 2023-08-08 DIAGNOSIS — S161XXA Strain of muscle, fascia and tendon at neck level, initial encounter: Secondary | ICD-10-CM | POA: Diagnosis not present

## 2023-08-08 DIAGNOSIS — Z6824 Body mass index (BMI) 24.0-24.9, adult: Secondary | ICD-10-CM | POA: Diagnosis not present

## 2023-08-08 DIAGNOSIS — Z1339 Encounter for screening examination for other mental health and behavioral disorders: Secondary | ICD-10-CM | POA: Diagnosis not present

## 2023-10-20 DIAGNOSIS — R300 Dysuria: Secondary | ICD-10-CM | POA: Diagnosis not present

## 2023-10-20 DIAGNOSIS — Z6824 Body mass index (BMI) 24.0-24.9, adult: Secondary | ICD-10-CM | POA: Diagnosis not present

## 2023-11-21 DIAGNOSIS — R509 Fever, unspecified: Secondary | ICD-10-CM | POA: Diagnosis not present

## 2023-11-21 DIAGNOSIS — Z6824 Body mass index (BMI) 24.0-24.9, adult: Secondary | ICD-10-CM | POA: Diagnosis not present

## 2023-11-21 DIAGNOSIS — L255 Unspecified contact dermatitis due to plants, except food: Secondary | ICD-10-CM | POA: Diagnosis not present

## 2024-03-15 ENCOUNTER — Other Ambulatory Visit (HOSPITAL_COMMUNITY)
Admission: RE | Admit: 2024-03-15 | Discharge: 2024-03-15 | Disposition: A | Source: Ambulatory Visit | Attending: Obstetrics and Gynecology | Admitting: Obstetrics and Gynecology

## 2024-03-15 ENCOUNTER — Ambulatory Visit: Admitting: Obstetrics and Gynecology

## 2024-03-15 VITALS — BP 124/80 | HR 76 | Ht 68.0 in | Wt 162.0 lb

## 2024-03-15 DIAGNOSIS — Z01419 Encounter for gynecological examination (general) (routine) without abnormal findings: Secondary | ICD-10-CM | POA: Insufficient documentation

## 2024-03-15 DIAGNOSIS — Z124 Encounter for screening for malignant neoplasm of cervix: Secondary | ICD-10-CM | POA: Diagnosis present

## 2024-03-15 DIAGNOSIS — N643 Galactorrhea not associated with childbirth: Secondary | ICD-10-CM

## 2024-03-15 DIAGNOSIS — Z975 Presence of (intrauterine) contraceptive device: Secondary | ICD-10-CM | POA: Diagnosis present

## 2024-03-15 DIAGNOSIS — N921 Excessive and frequent menstruation with irregular cycle: Secondary | ICD-10-CM | POA: Diagnosis present

## 2024-03-15 DIAGNOSIS — R300 Dysuria: Secondary | ICD-10-CM | POA: Diagnosis not present

## 2024-03-15 DIAGNOSIS — R8290 Unspecified abnormal findings in urine: Secondary | ICD-10-CM | POA: Diagnosis not present

## 2024-03-15 LAB — POCT URINALYSIS DIPSTICK
Bilirubin, UA: NEGATIVE
Blood, UA: NEGATIVE
Glucose, UA: NEGATIVE
Ketones, UA: NEGATIVE
Nitrite, UA: POSITIVE
Protein, UA: NEGATIVE
Spec Grav, UA: 1.015
Urobilinogen, UA: 0.2 U/dL
pH, UA: 6

## 2024-03-15 MED ORDER — CEFADROXIL 500 MG PO CAPS: 500.0000 mg | ORAL_CAPSULE | Freq: Two times a day (BID) | ORAL | 0 refills | Status: AC

## 2024-03-15 MED ORDER — FLUCONAZOLE 150 MG PO TABS: 150.0000 mg | ORAL_TABLET | Freq: Once | ORAL | 0 refills | Status: AC

## 2024-03-15 NOTE — Progress Notes (Signed)
 GAD and PHQ is elevated today, pt has current provider that is treating.

## 2024-03-16 ENCOUNTER — Encounter: Payer: Self-pay | Admitting: Obstetrics and Gynecology

## 2024-03-16 ENCOUNTER — Ambulatory Visit: Payer: Self-pay | Admitting: Obstetrics and Gynecology

## 2024-03-16 DIAGNOSIS — N643 Galactorrhea not associated with childbirth: Secondary | ICD-10-CM | POA: Insufficient documentation

## 2024-03-16 DIAGNOSIS — N921 Excessive and frequent menstruation with irregular cycle: Secondary | ICD-10-CM | POA: Insufficient documentation

## 2024-03-16 LAB — CERVICOVAGINAL ANCILLARY ONLY
Bacterial Vaginitis (gardnerella): NEGATIVE
Candida Glabrata: NEGATIVE
Candida Vaginitis: NEGATIVE
Chlamydia: NEGATIVE
Comment: NEGATIVE
Comment: NEGATIVE
Comment: NEGATIVE
Comment: NEGATIVE
Comment: NEGATIVE
Comment: NORMAL
Neisseria Gonorrhea: NEGATIVE
Trichomonas: NEGATIVE

## 2024-03-16 LAB — TSH RFX ON ABNORMAL TO FREE T4: TSH: 2.13 u[IU]/mL (ref 0.450–4.500)

## 2024-03-16 LAB — BETA HCG QUANT (REF LAB): hCG Quant: <1 m[IU]/mL

## 2024-03-16 LAB — PROLACTIN: Prolactin: 23.1 ng/mL (ref 4.8–33.4)

## 2024-03-16 NOTE — Procedures (Signed)
 Intrauterine Device (IUD) Removal Procedure Note  Patient has had Kyleena  since 08/2022 and has chronic AUB and cramping with it and is amenable to removal today  CMA present for exam. EGBUS normal. Vaginal vault normal. Cervix normal with IUD strings seen (approx 3-4cm in length). Strings grasped with ringed forceps and easily removed and noted to be intact.   No complications, patient tolerated the procedure well. Patient to see how bleeding is and RTC in 6 weeks and consider contraception options at that point.   Bebe Izell Raddle MD Attending Center for [employer] (Faculty Practice) 03/15/2024

## 2024-03-16 NOTE — Progress Notes (Signed)
 Obstetrics and Gynecology Annual Patient Evaluation  Appointment Date: 03/15/2024  OBGYN Clinic: Center for [facility]   Primary Care Provider: Fernand Seen A  Referring Provider: Fernand Seen LABOR, MD  Chief Complaint:  Chief Complaint  Patient presents with   Annual Exam    History of Present Illness: Stephanie Velazquez is a 24 y.o. G1P1001, seen for the above chief complaint.  Patient had Kyleena  placed in July 2024 and she states she's had AUB and intense cramping that could be her period.    Patient states that since her delivery in 2022, where she did breastfeed postpartum, that she's had occasional left sided galactorrhea. It appears to be from one opening and she states that she'll feel itchy at that breast and then squeeze the nipple and will notice white/clear/off white d/c. She says it happens about once a month and no lumps, bumps, skin changes.  Patient also noticing dysuria.   Review of Systems: Pertinent items are noted in HPI.   Past Medical History:  Past Medical History:  Diagnosis Date   Anxiety    Depression    Just started back on meds   Headache    Infection    UTI   Pregnancy induced hypertension    Past Surgical History:  Past Surgical History:  Procedure Laterality Date   WISDOM TOOTH EXTRACTION  04/02/2016   Past Obstetrical History:  OB History  Gravida Para Term Preterm AB Living  1 1 1  0 0 1  SAB IAB Ectopic Multiple Live Births  0 0 0 0 1    # Outcome Date GA Lbr Len/2nd Weight Sex Type Anes PTL Lv  1 Term 11/04/20 [redacted]w[redacted]d 03:10 / 01:41 7 lb 6.2 oz (3.351 kg) F Vag-Spont EPI, Local  LIV     Birth Comments: WNL   Past Gynecological History: As per HPI. History of Pap Smear(s): Yes.   Last pap 04/2020, which was NILM  Social History:  Social History   Socioeconomic History   Marital status: Single    Spouse name: Not on file   Number of children: Not on file   Years of education: Not on file   Highest education  level: Not on file  Occupational History   Not on file  Tobacco Use   Smoking status: Never   Smokeless tobacco: Never  Vaping Use   Vaping status: Never Used  Substance and Sexual Activity   Alcohol use: No   Drug use: No   Sexual activity: Yes    Partners: Male    Birth control/protection: Pill  Other Topics Concern   Not on file  Social History Narrative   Not on file   Social Drivers of Health   Tobacco Use: Low Risk (09/02/2022)   Patient History    Smoking Tobacco Use: Never    Smokeless Tobacco Use: Never    Passive Exposure: Not on file  Financial Resource Strain: Not on file  Food Insecurity: Not on file  Transportation Needs: Not on file  Physical Activity: Not on file  Stress: Not on file  Social Connections: Not on file  Intimate Partner Violence: Not on file  Depression (EYV7-0): Medium Risk (03/15/2024)   Depression (PHQ2-9)    PHQ-2 Score: 6  Alcohol Screen: Not on file  Housing: Not on file  Utilities: Not on file  Health Literacy: Not on file   Family History:  Family History  Problem Relation Age of Onset   Healthy Father  Healthy Mother    Medications Melonie T. Hawbaker had no medications administered during this visit. Current Outpatient Medications  Medication Sig Dispense Refill   ALPRAZolam (XANAX) 0.25 MG tablet Take 0.25 mg by mouth at bedtime as needed for anxiety.     cyanocobalamin  (VITAMIN B12) 1000 MCG/ML injection Inject into the muscle.     levonorgestrel  (KYLEENA ) 19.5 MG IUD 1 each by Intrauterine route once.     No current facility-administered medications for this visit.   Allergies Flagyl [metronidazole] and Monistat [miconazole]  Physical Exam:  BP 124/80   Pulse 76   Ht 5' 8 (1.727 m)   Wt 162 lb (73.5 kg)   BMI 24.63 kg/m  Body mass index is 24.63 kg/m. CMA present for exams General appearance: Well nourished, well developed female in no acute distress.  Neck:  Supple, normal appearance, and no thyromegaly   Cardiovascular: normal s1 and s2.  No murmurs, rubs or gallops. Respiratory:  Clear to auscultation bilateral. Normal respiratory effort Abdomen: positive bowel sounds and no masses, hernias; diffusely non tender to palpation, non distended Breasts: breasts appear normal, no suspicious masses, no skin or nipple changes or axillary nodes. Negative palpation and unable to express anything from left nipple.  Neuro/Psych:  Normal mood and affect.  Skin:  Warm and dry.  Lymphatic:  No inguinal lymphadenopathy.  Pelvic exam: is not limited by body habitus EGBUS: within normal limits Vagina: within normal limits and with no blood or discharge in the vault Cervix: normal appearing cervix without tenderness, discharge or lesions. IUD strings 3-4 Uterus:  nonenlarged and non tender Adnexa:  normal adnexa and no mass, fullness, tenderness Rectovaginal: deferred  IUD removed w/o issue and intact  Laboratory: u/a with +nitrites.   Radiology: none  Assessment: patient stable  Plan: 1. Well woman exam with routine gynecological exam (Primary) - Cytology - PAP( [place]) - Urine Culture - TSH Rfx on Abnormal to Free T4 - Prolactin - Beta hCG quant (ref lab) - POCT urinalysis dipstick - Cervicovaginal ancillary only( [place])  2. Galactorrhea If negative labs, I told her that I don't feel she needs further work up but if she desires, can refer for breast u/s and a breast specialist - TSH Rfx on Abnormal to Free T4 - Prolactin - Beta hCG quant (ref lab)  3. Abnormal finding on urinalysis Duricef sent in - Urine Culture  4. Breakthrough bleeding associated with intrauterine device (IUD) D/w her re: u/s vs removal today, which I recommend the latter and she is amenable to. F/u in 6 weeks to see how bleeding is off hormones - Cytology - PAP( [place]) - Cervicovaginal ancillary only( [place])  5. Dysuria - Urine Culture  6. Cervical cancer screening - Cytology - PAP(  [place])  RTC 6 weeks   Bebe Izell Raddle MD Attending Center for [employer] [facility])

## 2024-03-19 LAB — URINE CULTURE

## 2024-03-21 LAB — CYTOLOGY - PAP: Diagnosis: NEGATIVE

## 2024-04-26 ENCOUNTER — Ambulatory Visit: Admitting: Family Medicine
